# Patient Record
Sex: Female | Born: 1990
Health system: Southern US, Community
[De-identification: ages and names within clinical notes are randomized; demographics above are authoritative.]

## PROBLEM LIST (undated history)

## (undated) ENCOUNTER — Inpatient Hospital Stay (HOSPITAL_COMMUNITY): Payer: Self-pay

## (undated) ENCOUNTER — Emergency Department (HOSPITAL_COMMUNITY): Admission: EM | Payer: 59 | Source: Home / Self Care

## (undated) DIAGNOSIS — D689 Coagulation defect, unspecified: Secondary | ICD-10-CM

## (undated) DIAGNOSIS — R002 Palpitations: Secondary | ICD-10-CM

## (undated) DIAGNOSIS — N96 Recurrent pregnancy loss: Secondary | ICD-10-CM

## (undated) DIAGNOSIS — D649 Anemia, unspecified: Secondary | ICD-10-CM

## (undated) DIAGNOSIS — D6851 Activated protein C resistance: Secondary | ICD-10-CM

## (undated) DIAGNOSIS — N390 Urinary tract infection, site not specified: Secondary | ICD-10-CM

## (undated) DIAGNOSIS — F419 Anxiety disorder, unspecified: Secondary | ICD-10-CM

## (undated) DIAGNOSIS — E282 Polycystic ovarian syndrome: Secondary | ICD-10-CM

## (undated) HISTORY — DX: Polycystic ovarian syndrome: E28.2

---

## 2016-01-10 ENCOUNTER — Telehealth: Payer: Self-pay | Admitting: *Deleted

## 2016-01-10 ENCOUNTER — Ambulatory Visit (INDEPENDENT_AMBULATORY_CARE_PROVIDER_SITE_OTHER): Payer: Self-pay | Admitting: *Deleted

## 2016-01-10 DIAGNOSIS — N912 Amenorrhea, unspecified: Secondary | ICD-10-CM

## 2016-01-10 DIAGNOSIS — Z3491 Encounter for supervision of normal pregnancy, unspecified, first trimester: Secondary | ICD-10-CM

## 2016-01-10 DIAGNOSIS — Z3201 Encounter for pregnancy test, result positive: Secondary | ICD-10-CM

## 2016-01-10 LAB — POCT URINE PREGNANCY: PREG TEST UR: POSITIVE — AB

## 2016-01-10 MED ORDER — VITAFOL ULTRA 29-0.6-0.4-200 MG PO CAPS
1.0000 | ORAL_CAPSULE | Freq: Every day | ORAL | 11 refills | Status: DC
Start: 2016-01-10 — End: 2016-01-14

## 2016-01-13 ENCOUNTER — Emergency Department (HOSPITAL_COMMUNITY)
Admission: EM | Admit: 2016-01-13 | Discharge: 2016-01-13 | Disposition: A | Discharge status: Home / Self Care | Payer: 59 | Attending: Emergency Medicine | Admitting: Emergency Medicine

## 2016-01-13 ENCOUNTER — Encounter (HOSPITAL_COMMUNITY): Payer: Self-pay | Admitting: Emergency Medicine

## 2016-01-13 DIAGNOSIS — Z87891 Personal history of nicotine dependence: Secondary | ICD-10-CM | POA: Diagnosis not present

## 2016-01-13 DIAGNOSIS — K120 Recurrent oral aphthae: Secondary | ICD-10-CM | POA: Diagnosis not present

## 2016-01-13 DIAGNOSIS — R51 Headache: Secondary | ICD-10-CM | POA: Insufficient documentation

## 2016-01-13 DIAGNOSIS — R519 Headache, unspecified: Secondary | ICD-10-CM

## 2016-01-13 MED ORDER — DIPHENHYDRAMINE HCL 12.5 MG/5ML PO ELIX
6.2500 mg | ORAL_SOLUTION | Freq: Once | ORAL | Status: AC
Start: 2016-01-13 — End: 2016-01-13
  Administered 2016-01-13: 6.25 mg via ORAL
  Filled 2016-01-13: qty 10

## 2016-01-13 MED ORDER — BENZOCAINE 10 % MT GEL: 1.0000 "application " | Freq: Three times a day (TID) | OROMUCOSAL | 0 refills | Status: DC | PRN

## 2016-01-13 MED ORDER — ACETAMINOPHEN 500 MG PO TABS
1000.0000 mg | ORAL_TABLET | Freq: Once | ORAL | Status: AC
  Administered 2016-01-13: 1000 mg via ORAL
  Filled 2016-01-13: qty 2

## 2016-01-13 MED ORDER — ALUM & MAG HYDROXIDE-SIMETH 200-200-20 MG/5ML PO SUSP
2.5000 mL | Freq: Once | ORAL | Status: AC
  Administered 2016-01-13: 2.5 mL via ORAL
  Filled 2016-01-13: qty 30

## 2016-01-13 NOTE — ED Provider Notes (Signed)
[facility]-EMERGENCY DEPT Provider Note   CSN: [id number] Arrival date & time: 01/13/16  0211     History   Chief Complaint Chief Complaint  Patient presents with  . Oral Swelling  . Headache    HPI Samantha DarlingSimone Jimenez is a 25 y.o. female G3P0020 with no other major medical hx presents to the Emergency Department complaining of gradual, persistent, progressively worsening mouth pain beginning in the right gingiva and radiating into the right ear and right side of the throat onset 24 hours ago.  Pt reports no sick contacts and does not usually get ulcers.  Eating and drinking makes the symptoms worse.  No treatments PTA.  Pt denies fever, chills, neck pain, neck stiffness, Feeling of throat closing, difficulty swallowing, difficulty breathing.   Pt also reports mild, generalized headache for the last 3 weeks.  She denies history of recurrent headaches.  She reports blurred vision for years and believes she needs glasses.  She denies changes in vision.  She has not taken any medications for this. She denies photophobia, phonophobia. She has nausea without vomiting.  Pt is without fever, neck pain or neck stiffness.  No rash.     The history is provided by the patient and medical records. No language interpreter was used.    History reviewed. No pertinent past medical history.  There are no active problems to display for this patient.   History reviewed. No pertinent surgical history.  OB History    Gravida Para Term Preterm AB Living   1             SAB TAB Ectopic Multiple Live Births                   Home Medications    Prior to Admission medications   Medication Sig Start Date End Date Taking? Authorizing Provider  benzocaine (ORAJEL) 10 % mucosal gel Use as directed 1 application in the mouth or throat 3 (three) times daily as needed for mouth pain. 01/13/16   Thunder [place], PA-C  Prenat-Fe Poly-Methfol-FA-DHA (VITAFOL ULTRA) 29-0.6-0.4-200 MG CAPS Take 1 capsule by  mouth daily. 01/10/16   Roe Coombsachelle A Denney, CNM    Family History History reviewed. No pertinent family history.  Social History Social History  Substance Use Topics  . Smoking status: Former Games developermoker  . Smokeless tobacco: Never Used  . Alcohol use No     Allergies   Percocet [oxycodone-acetaminophen]   Review of Systems Review of Systems  HENT: Positive for mouth sores.   Neurological: Positive for headaches.  All other systems reviewed and are negative.    Physical Exam Updated Vital Signs BP 120/86   Pulse 88   Temp 98.4 F (36.9 C) (Oral)   Resp 16   Ht 5\' 4"  (1.626 m)   Wt 66.7 kg   SpO2 100%   BMI 25.24 kg/m   Physical Exam  Constitutional: She is oriented to person, place, and time. She appears well-developed and well-nourished. No distress.  HENT:  Head: Normocephalic and atraumatic.  Right Ear: Hearing, tympanic membrane, external ear and ear canal normal.  Left Ear: Hearing, tympanic membrane, external ear and ear canal normal.  Nose: Nose normal.  Mouth/Throat: Uvula is midline, oropharynx is clear and moist and mucous membranes are normal. She does not have dentures. Oral lesions present. No trismus in the jaw. Normal dentition. No dental abscesses, lacerations or dental caries. No oropharyngeal exudate, posterior oropharyngeal edema, posterior oropharyngeal erythema or tonsillar abscesses. Tonsils are  1+ on the right. Tonsils are 1+ on the left.  Aphthous ulcer noted at the intersection of the gingiva and buccal Mucosa of the right upper quadrant of her mouth. Open crypts noted to the tonsils but no edema, erythema or exudate  Eyes: Conjunctivae and EOM are normal. Pupils are equal, round, and reactive to light. No scleral icterus.  No horizontal, vertical or rotational nystagmus  Neck: Normal range of motion, full passive range of motion without pain and phonation normal. Neck supple.  Full active and passive ROM without pain No midline or paraspinal  tenderness No nuchal rigidity or meningeal signs Normal phonation Handling secretions without difficulty  Cardiovascular: Normal rate, regular rhythm and intact distal pulses.   Pulmonary/Chest: Effort normal and breath sounds normal. No respiratory distress. She has no wheezes. She has no rales.  Abdominal: Soft. Bowel sounds are normal. There is no tenderness. There is no rebound and no guarding.  Musculoskeletal: Normal range of motion.  Lymphadenopathy:    She has no cervical adenopathy.  Neurological: She is alert and oriented to person, place, and time. She has normal reflexes. No cranial nerve deficit. She exhibits normal muscle tone. Coordination normal.  Mental Status:  Alert, oriented, thought content appropriate. Speech fluent without evidence of aphasia. Able to follow 2 step commands without difficulty.  Cranial Nerves:  II:  Peripheral visual fields grossly normal, pupils equal, round, reactive to light III,IV, VI: ptosis not present, extra-ocular motions intact bilaterally  V,VII: smile symmetric, facial light touch sensation equal VIII: hearing grossly normal bilaterally  IX,X: midline uvula rise  XI: bilateral shoulder shrug equal and strong XII: midline tongue extension  Motor:  5/5 in upper and lower extremities bilaterally including strong and equal grip strength and dorsiflexion/plantar flexion Sensory: Pinprick and light touch normal in all extremities.  Deep Tendon Reflexes: 2+ and symmetric  Cerebellar: normal finger-to-nose with bilateral upper extremities Gait: normal gait and balance CV: distal pulses palpable throughout   Skin: Skin is warm and dry. No rash noted. She is not diaphoretic.  Psychiatric: She has a normal mood and affect. Her behavior is normal. Judgment and thought content normal.  Nursing note and vitals reviewed.    ED Treatments / Results   Procedures Procedures (including critical care time)  Medications Ordered in ED Medications    diphenhydrAMINE (BENADRYL) 12.5 MG/5ML elixir 6.25 mg (6.25 mg Oral Given 01/13/16 0406)  alum & mag hydroxide-simeth (MAALOX/MYLANTA) 200-200-20 MG/5ML suspension 2.5 mL (2.5 mLs Oral Given 01/13/16 0406)  acetaminophen (TYLENOL) tablet 1,000 mg (1,000 mg Oral Given 01/13/16 0413)     Initial Impression / Assessment and Plan / ED Course  I have reviewed the triage vital signs and the nursing notes.  Pertinent labs & imaging results that were available during my care of the patient were reviewed by me and considered in my medical decision making (see chart for details).  Clinical Course  Patient presents with mouth pain radiating to the ear and throat. Aphthous ulcer noted. No tenderness to palpation of the teeth. No evidence of spread of infection. No evidence of Ludwig angina or peritonsillar abscess. Patient is afebrile with nausea or vomiting. No nuchal rigidity. No evidence of meningitis.  Patient also complains of generalized headache without vision changes, fever, nuchal rigidity.  She has a normal neurologic exam. We have given her Tylenol with improvement in her pain. No red flags for her headache. We'll follow-up with primary care.  Final Clinical Impressions(s) / ED Diagnoses   Final  diagnoses:  Nonintractable headache, unspecified chronicity pattern, unspecified headache type  Aphthous ulcer of mouth    New Prescriptions Discharge Medication List as of 01/13/2016  4:09 AM    START taking these medications   Details  benzocaine (ORAJEL) 10 % mucosal gel Use as directed 1 application in the mouth or throat 3 (three) times daily as needed for mouth pain., Starting Sun 01/13/2016, Print         Dahlia Client Altheia Shafran, PA-C 01/13/16 1610    Linwood Dibbles, MD 01/14/16 2128

## 2016-01-13 NOTE — Discharge Instructions (Signed)
1. Medications: orajel for mouth pain, tylenol for headaches, usual home medications 2. Treatment: rest, drink plenty of fluids,  3. Follow Up: Please followup with your primary doctor in 1-2 days for discussion of your diagnoses and further evaluation after today's visit; if you do not have a primary care doctor use the resource guide provided to find one; Please return to the ER for worsening symptoms, fevers, chills, neck stiffness, difficulty talking or swallowing.

## 2016-01-13 NOTE — ED Triage Notes (Signed)
Pt presents to ED for assessment of gum swelling and pain beginning 24 hours ago and worsening to the point pt was awoken from sleep tonight.  Pt is [redacted] weeks pregnant.  Pt also c/o headaches starting 3 weeks ago, and increasing with gum pain.

## 2016-01-14 ENCOUNTER — Other Ambulatory Visit: Payer: Self-pay

## 2016-01-14 ENCOUNTER — Telehealth: Payer: Self-pay

## 2016-01-14 DIAGNOSIS — Z3491 Encounter for supervision of normal pregnancy, unspecified, first trimester: Secondary | ICD-10-CM

## 2016-01-14 MED ORDER — VITAFOL ULTRA 29-0.6-0.4-200 MG PO CAPS: 1.0000 | ORAL_CAPSULE | Freq: Every day | ORAL | 11 refills | Status: DC

## 2016-01-14 NOTE — Telephone Encounter (Signed)
Called patient back regarding RX request sent to pharmacy due to wrong insurance coverage at that time. Patient now has correct insurance and RX resent to pharmacy.

## 2016-01-19 ENCOUNTER — Inpatient Hospital Stay (HOSPITAL_COMMUNITY): Payer: Medicaid Other

## 2016-01-19 ENCOUNTER — Inpatient Hospital Stay (HOSPITAL_COMMUNITY)
Admission: AD | Admit: 2016-01-19 | Discharge: 2016-01-19 | Disposition: A | Discharge status: Home / Self Care | Payer: Medicaid Other | Attending: Obstetrics & Gynecology | Admitting: Obstetrics & Gynecology

## 2016-01-19 ENCOUNTER — Encounter (HOSPITAL_COMMUNITY): Payer: Self-pay

## 2016-01-19 DIAGNOSIS — O209 Hemorrhage in early pregnancy, unspecified: Secondary | ICD-10-CM | POA: Diagnosis present

## 2016-01-19 DIAGNOSIS — Z87891 Personal history of nicotine dependence: Secondary | ICD-10-CM | POA: Insufficient documentation

## 2016-01-19 DIAGNOSIS — O23591 Infection of other part of genital tract in pregnancy, first trimester: Secondary | ICD-10-CM | POA: Diagnosis not present

## 2016-01-19 DIAGNOSIS — N760 Acute vaginitis: Secondary | ICD-10-CM | POA: Diagnosis not present

## 2016-01-19 DIAGNOSIS — B9689 Other specified bacterial agents as the cause of diseases classified elsewhere: Secondary | ICD-10-CM

## 2016-01-19 DIAGNOSIS — Z3A01 Less than 8 weeks gestation of pregnancy: Secondary | ICD-10-CM | POA: Insufficient documentation

## 2016-01-19 LAB — CBC
HCT: 39 % (ref 36.0–46.0)
Hemoglobin: 13.6 g/dL (ref 12.0–15.0)
MCH: 31.1 pg (ref 26.0–34.0)
MCHC: 34.9 g/dL (ref 30.0–36.0)
MCV: 89 fL (ref 78.0–100.0)
PLATELETS: 270 10*3/uL (ref 150–400)
RBC: 4.38 MIL/uL (ref 3.87–5.11)
RDW: 12.5 % (ref 11.5–15.5)
WBC: 6.2 10*3/uL (ref 4.0–10.5)

## 2016-01-19 LAB — WET PREP, GENITAL
SPERM: NONE SEEN
TRICH WET PREP: NONE SEEN
Yeast Wet Prep HPF POC: NONE SEEN

## 2016-01-19 LAB — URINALYSIS, ROUTINE W REFLEX MICROSCOPIC
Bilirubin Urine: NEGATIVE
GLUCOSE, UA: NEGATIVE mg/dL
KETONES UR: NEGATIVE mg/dL
LEUKOCYTES UA: NEGATIVE
Nitrite: NEGATIVE
PH: 6 (ref 5.0–8.0)
Protein, ur: NEGATIVE mg/dL
Specific Gravity, Urine: 1.02 (ref 1.005–1.030)

## 2016-01-19 LAB — URINE MICROSCOPIC-ADD ON

## 2016-01-19 LAB — HCG, QUANTITATIVE, PREGNANCY: HCG, BETA CHAIN, QUANT, S: 303 m[IU]/mL — AB (ref <5)

## 2016-01-19 LAB — ABO/RH: ABO/RH(D): O POS

## 2016-01-19 MED ORDER — METRONIDAZOLE 500 MG PO TABS: 500.0000 mg | ORAL_TABLET | Freq: Two times a day (BID) | ORAL | 0 refills | Status: DC

## 2016-01-19 MED ORDER — PROMETHAZINE HCL 12.5 MG PO TABS: 12.5000 mg | ORAL_TABLET | Freq: Four times a day (QID) | ORAL | 4 refills | Status: DC | PRN

## 2016-01-19 NOTE — Discharge Instructions (Signed)
Bacterial Vaginosis °Bacterial vaginosis is a vaginal infection that occurs when the normal balance of bacteria in the vagina is disrupted. It results from an overgrowth of certain bacteria. This is the most common vaginal infection in women of childbearing age. Treatment is important to prevent complications, especially in pregnant women, as it can cause a premature delivery. °CAUSES  °Bacterial vaginosis is caused by an increase in harmful bacteria that are normally present in smaller amounts in the vagina. Several different kinds of bacteria can cause bacterial vaginosis. However, the reason that the condition develops is not fully understood. °RISK FACTORS °Certain activities or behaviors can put you at an increased risk of developing bacterial vaginosis, including: °· Having a new sex partner or multiple sex partners. °· Douching. °· Using an intrauterine device (IUD) for contraception. °Women do not get bacterial vaginosis from toilet seats, bedding, swimming pools, or contact with objects around them. °SIGNS AND SYMPTOMS  °Some women with bacterial vaginosis have no signs or symptoms. Common symptoms include: °· Grey vaginal discharge. °· A fishlike odor with discharge, especially after sexual intercourse. °· Itching or burning of the vagina and vulva. °· Burning or pain with urination. °DIAGNOSIS  °Your health care provider will take a medical history and examine the vagina for signs of bacterial vaginosis. A sample of vaginal fluid may be taken. Your health care provider will look at this sample under a microscope to check for bacteria and abnormal cells. A vaginal pH test may also be done.  °TREATMENT  °Bacterial vaginosis may be treated with antibiotic medicines. These may be given in the form of a pill or a vaginal cream. A second round of antibiotics may be prescribed if the condition comes back after treatment. Because bacterial vaginosis increases your risk for sexually transmitted diseases, getting  treated can help reduce your risk for chlamydia, gonorrhea, HIV, and herpes. °HOME CARE INSTRUCTIONS  °· Only take over-the-counter or prescription medicines as directed by your health care provider. °· If antibiotic medicine was prescribed, take it as directed. Make sure you finish it even if you start to feel better. °· Tell all sexual partners that you have a vaginal infection. They should see their health care provider and be treated if they have problems, such as a mild rash or itching. °· During treatment, it is important that you follow these instructions: °· Avoid sexual activity or use condoms correctly. °· Do not douche. °· Avoid alcohol as directed by your health care provider. °· Avoid breastfeeding as directed by your health care provider. °SEEK MEDICAL CARE IF:  °· Your symptoms are not improving after 3 days of treatment. °· You have increased discharge or pain. °· You have a fever. °MAKE SURE YOU:  °· Understand these instructions. °· Will watch your condition. °· Will get help right away if you are not doing well or get worse. °FOR MORE INFORMATION  °Centers for Disease Control and Prevention, Division of STD Prevention: www.cdc.gov/std °American Sexual Health Association (ASHA): www.ashastd.org  °  °This information is not intended to replace advice given to you by your health care provider. Make sure you discuss any questions you have with your health care provider. °  °Document Released: 03/10/2005 Document Revised: 03/31/2014 Document Reviewed: 10/20/2012 °Elsevier Interactive Patient Education ©2016 Elsevier Inc. ° °Threatened Miscarriage °A threatened miscarriage occurs when you have vaginal bleeding during your first 20 weeks of pregnancy but the pregnancy has not ended. If you have vaginal bleeding during this time, your health care provider   do tests to make sure you are still pregnant. If the tests show you are still pregnant and the developing baby (fetus) inside your womb (uterus) is  still growing, your condition is considered a threatened miscarriage. A threatened miscarriage does not mean your pregnancy will end, but it does increase the risk of losing your pregnancy (complete miscarriage). CAUSES  The cause of a threatened miscarriage is usually not known. If you go on to have a complete miscarriage, the most common cause is an abnormal number of chromosomes in the developing baby. Chromosomes are the structures inside cells that hold all your genetic material. Some causes of vaginal bleeding that do not result in miscarriage include:  Having sex.  Having an infection.  Normal hormone changes of pregnancy.  Bleeding that occurs when an egg implants in your uterus. RISK FACTORS Risk factors for bleeding in early pregnancy include:  Obesity.  Smoking.  Drinking excessive amounts of alcohol or caffeine.  Recreational drug use. SIGNS AND SYMPTOMS  Light vaginal bleeding.  Mild abdominal pain or cramps. DIAGNOSIS  If you have bleeding with or without abdominal pain before 20 weeks of pregnancy, your health care provider will do tests to check whether you are still pregnant. One important test involves using sound waves and a computer (ultrasound) to create images of the inside of your uterus. Other tests include an internal exam of your vagina and uterus (pelvic exam) and measurement of your baby's heart rate.  You may be diagnosed with a threatened miscarriage if:  Ultrasound testing shows you are still pregnant.  Your baby's heart rate is strong.  A pelvic exam shows that the opening between your uterus and your vagina (cervix) is closed.  Your heart rate and blood pressure are stable.  Blood tests confirm you are still pregnant. TREATMENT  No treatments have been shown to prevent a threatened miscarriage from going on to a complete miscarriage. However, the right home care is important.  HOME CARE INSTRUCTIONS   Make sure you keep all your  appointments for prenatal care. This is very important.  Get plenty of rest.  Do not have sex or use tampons if you have vaginal bleeding.  Do not douche.  Do not smoke or use recreational drugs.  Do not drink alcohol.  Avoid caffeine. SEEK MEDICAL CARE IF:  You have light vaginal bleeding or spotting while pregnant.  You have abdominal pain or cramping.  You have a fever. SEEK IMMEDIATE MEDICAL CARE IF:  You have heavy vaginal bleeding.  You have blood clots coming from your vagina.  You have severe low back pain or abdominal cramps.  You have fever, chills, and severe abdominal pain. MAKE SURE YOU:  Understand these instructions.  Will watch your condition.  Will get help right away if you are not doing well or get worse.   This information is not intended to replace advice given to you by your health care provider. Make sure you discuss any questions you have with your health care provider.   Document Released: 03/10/2005 Document Revised: 03/15/2013 Document Reviewed: 01/04/2013 Elsevier Interactive Patient Education [employer]2016 Elsevier Inc.

## 2016-01-19 NOTE — MAU Provider Note (Signed)
  History     CSN: [id number]  Arrival date and time: 01/19/16 [id number]   First Provider Initiated Contact with Patient 01/19/16 1025      Chief Complaint  Patient presents with  . Vaginal Bleeding   HPI Samantha Jimenez 25 y.o. [id number]  Having light red vaginal bleeding for several days.  Also has noted periodic lower abdominal cramping and some low back pain.Marland Kitchen.  Hx of irregular periods and 2 previous early miscarriages.  Has an appointment at [facility] next week.  Has not yet been seen for prenatal care.  Is worried about this pregnancy.     OB History    Gravida Para Term Preterm AB Living   3       2     SAB TAB Ectopic Multiple Live Births   2              No past medical history on file.  No past surgical history on file.  No family history on file.  Social History  Substance Use Topics  . Smoking status: Former Games developermoker  . Smokeless tobacco: Never Used  . Alcohol use No    Allergies:  Allergies  Allergen Reactions  . Percocet [Oxycodone-Acetaminophen] Itching and Nausea And Vomiting    Prescriptions Prior to Admission  Medication Sig Dispense Refill Last Dose  . acetaminophen (TYLENOL) 325 MG tablet Take 650 mg by mouth every 6 (six) hours as needed for moderate pain.   Past Month at Unknown time  . Prenat-Fe Poly-Methfol-FA-DHA (VITAFOL ULTRA) 29-0.6-0.4-200 MG CAPS Take 1 capsule by mouth daily. 30 capsule 11 01/18/2016 at Unknown time  . benzocaine (ORAJEL) 10 % mucosal gel Use as directed 1 application in the mouth or throat 3 (three) times daily as needed for mouth pain. (Patient not taking: Reported on 01/19/2016) 5.3 g 0 Not Taking at Unknown time    Review of Systems  Constitutional: Negative for fever.  Gastrointestinal: Positive for abdominal pain. Negative for nausea and vomiting.  Genitourinary:       No vaginal discharge. Vaginal bleeding. No dysuria.  Musculoskeletal: Positive for back pain.   Physical Exam   Blood pressure 119/78, pulse 87,  temperature 98 F (36.7 C), resp. rate 16, height 5\' 4"  (1.626 m), weight 147 lb (66.7 kg), last menstrual period 11/26/2015.  Physical Exam  MAU Course  Procedures  MDM   Assessment and Plan    Lopaka Karge L Myrle Dues 01/19/2016, 10:33 AM

## 2016-01-19 NOTE — MAU Note (Signed)
Patient presents with vagina bleeding that started yesterday afternoon, lighter than her normal period.

## 2016-01-20 LAB — RPR: RPR Ser Ql: NONREACTIVE

## 2016-01-20 LAB — HIV ANTIBODY (ROUTINE TESTING W REFLEX): HIV Screen 4th Generation wRfx: NONREACTIVE

## 2016-01-21 ENCOUNTER — Encounter (HOSPITAL_COMMUNITY): Payer: Self-pay | Admitting: *Deleted

## 2016-01-21 ENCOUNTER — Inpatient Hospital Stay (HOSPITAL_COMMUNITY)
Admission: AD | Admit: 2016-01-21 | Discharge: 2016-01-21 | Disposition: A | Discharge status: Home / Self Care | Payer: 59 | Source: Ambulatory Visit | Attending: Family Medicine | Admitting: Family Medicine

## 2016-01-21 DIAGNOSIS — Z87891 Personal history of nicotine dependence: Secondary | ICD-10-CM | POA: Diagnosis not present

## 2016-01-21 DIAGNOSIS — Z3A08 8 weeks gestation of pregnancy: Secondary | ICD-10-CM | POA: Diagnosis not present

## 2016-01-21 DIAGNOSIS — O209 Hemorrhage in early pregnancy, unspecified: Secondary | ICD-10-CM | POA: Diagnosis present

## 2016-01-21 DIAGNOSIS — O039 Complete or unspecified spontaneous abortion without complication: Secondary | ICD-10-CM | POA: Insufficient documentation

## 2016-01-21 LAB — CBC
HCT: 39.6 % (ref 36.0–46.0)
Hemoglobin: 13.9 g/dL (ref 12.0–15.0)
MCH: 31.2 pg (ref 26.0–34.0)
MCHC: 35.1 g/dL (ref 30.0–36.0)
MCV: 88.8 fL (ref 78.0–100.0)
PLATELETS: 273 10*3/uL (ref 150–400)
RBC: 4.46 MIL/uL (ref 3.87–5.11)
RDW: 12.7 % (ref 11.5–15.5)
WBC: 6.7 10*3/uL (ref 4.0–10.5)

## 2016-01-21 LAB — GC/CHLAMYDIA PROBE AMP (~~LOC~~) NOT AT ARMC
Chlamydia: NEGATIVE
Neisseria Gonorrhea: NEGATIVE

## 2016-01-21 LAB — HCG, QUANTITATIVE, PREGNANCY: hCG, Beta Chain, Quant, S: 95 m[IU]/mL — ABNORMAL HIGH (ref <5)

## 2016-01-21 MED ORDER — IBUPROFEN 600 MG PO TABS: 600.0000 mg | ORAL_TABLET | Freq: Four times a day (QID) | ORAL | 1 refills | Status: DC | PRN

## 2016-01-21 MED ORDER — HYDROMORPHONE HCL 2 MG PO TABS
2.0000 mg | ORAL_TABLET | ORAL | Status: DC | PRN
  Administered 2016-01-21: 2 mg via ORAL
  Filled 2016-01-21: qty 1

## 2016-01-21 MED ORDER — OXYCODONE-ACETAMINOPHEN 5-325 MG PO TABS: 1.0000 | ORAL_TABLET | ORAL | 0 refills | Status: DC | PRN

## 2016-01-21 NOTE — MAU Note (Signed)
Is having a miscarriage, her third one. Started spotting 3 days ago, is now bleeding heavier and passing penny sized clots.  Is having cramping

## 2016-01-21 NOTE — Discharge Instructions (Signed)
Miscarriage  A miscarriage is the sudden loss of an unborn baby (fetus) before the 20th week of pregnancy. Most miscarriages happen in the first 3 months of pregnancy. Sometimes, it happens before a woman even knows she is pregnant. A miscarriage is also called a "spontaneous miscarriage" or "early pregnancy loss." Having a miscarriage can be an emotional experience. Talk with your caregiver about any questions you may have about miscarrying, the grieving process, and your future pregnancy plans.  CAUSES    Problems with the fetal chromosomes that make it impossible for the baby to develop normally. Problems with the baby's genes or chromosomes are most often the result of errors that occur, by chance, as the embryo divides and grows. The problems are not inherited from the parents.   Infection of the cervix or uterus.    Hormone problems.    Problems with the cervix, such as having an incompetent cervix. This is when the tissue in the cervix is not strong enough to hold the pregnancy.    Problems with the uterus, such as an abnormally shaped uterus, uterine fibroids, or congenital abnormalities.    Certain medical conditions.    Smoking, drinking alcohol, or taking illegal drugs.    Trauma.   Often, the cause of a miscarriage is unknown.   SYMPTOMS    Vaginal bleeding or spotting, with or without cramps or pain.   Pain or cramping in the abdomen or lower back.   Passing fluid, tissue, or blood clots from the vagina.  DIAGNOSIS   Your caregiver will perform a physical exam. You may also have an ultrasound to confirm the miscarriage. Blood or urine tests may also be ordered.  TREATMENT    Sometimes, treatment is not necessary if you naturally pass all the fetal tissue that was in the uterus. If some of the fetus or placenta remains in the body (incomplete miscarriage), tissue left behind may become infected and must be removed. Usually, a dilation and curettage (D and C) procedure is performed.  During a D and C procedure, the cervix is widened (dilated) and any remaining fetal or placental tissue is gently removed from the uterus.   Antibiotic medicines are prescribed if there is an infection. Other medicines may be given to reduce the size of the uterus (contract) if there is a lot of bleeding.   If you have Rh negative blood and your baby was Rh positive, you will need a Rh immunoglobulin shot. This shot will protect any future baby from having Rh blood problems in future pregnancies.  HOME CARE INSTRUCTIONS    Your caregiver may order bed rest or may allow you to continue light activity. Resume activity as directed by your caregiver.   Have someone help with home and family responsibilities during this time.    Keep track of the number of sanitary pads you use each day and how soaked (saturated) they are. Write down this information.    Do not use tampons. Do not douche or have sexual intercourse until approved by your caregiver.    Only take over-the-counter or prescription medicines for pain or discomfort as directed by your caregiver.    Do not take aspirin. Aspirin can cause bleeding.    Keep all follow-up appointments with your caregiver.    If you or your partner have problems with grieving, talk to your caregiver or seek counseling to help cope with the pregnancy loss. Allow enough time to grieve before trying to get pregnant again.     SEEK IMMEDIATE MEDICAL CARE IF:    You have severe cramps or pain in your back or abdomen.   You have a fever.   You pass large blood clots (walnut-sized or larger) ortissue from your vagina. Save any tissue for your caregiver to inspect.    Your bleeding increases.    You have a thick, bad-smelling vaginal discharge.   You become lightheaded, weak, or you faint.    You have chills.   MAKE SURE YOU:   Understand these instructions.   Will watch your condition.   Will get help right away if you are not doing well or get worse.     This  information is not intended to replace advice given to you by your health care provider. Make sure you discuss any questions you have with your health care provider.     Document Released: 09/03/2000 Document Revised: 07/05/2012 Document Reviewed: 04/29/2011  Elsevier Interactive Patient Education 2016 Elsevier Inc.

## 2016-01-21 NOTE — MAU Provider Note (Signed)
  History     CSN: [id number]  Arrival date and time: 01/21/16 1641   First Provider Initiated Contact with Patient 01/21/16 1745      Chief Complaint  Patient presents with  . Abdominal Pain  . Vaginal Bleeding  . Threatened Miscarriage   HPI 25 yo G3P0020 at 6156w0d by LMP presenting today with vaginal bleeding and cramping. Patient reports onset of vaginal spotting 3 days ago but today it progressed with a heavier flow with passage of small clots. She also reports some sharp cramping pains. Patient reports that this is her 3rd miscarriage. They were al in the first trimester. The first 2 were with her husband and this pregnancy is with a different partner. She denies any medical conditions.   History reviewed. No pertinent past medical history.  History reviewed. No pertinent surgical history.  History reviewed. No pertinent family history.  Social History  Substance Use Topics  . Smoking status: Former Games developermoker  . Smokeless tobacco: Never Used  . Alcohol use No    Allergies:  Allergies  Allergen Reactions  . Percocet [Oxycodone-Acetaminophen] Itching and Nausea And Vomiting    Prescriptions Prior to Admission  Medication Sig Dispense Refill Last Dose  . acetaminophen (TYLENOL) 325 MG tablet Take 650 mg by mouth every 6 (six) hours as needed for mild pain, moderate pain or headache.    01/21/2016 at 1100  . metroNIDAZOLE (FLAGYL) 500 MG tablet Take 1 tablet (500 mg total) by mouth 2 (two) times daily. 14 tablet 0 01/21/2016 at Unknown time  . Prenat-Fe Poly-Methfol-FA-DHA (VITAFOL ULTRA) 29-0.6-0.4-200 MG CAPS Take 1 capsule by mouth daily. 30 capsule 11 01/21/2016 at Unknown time  . promethazine (PHENERGAN) 12.5 MG tablet Take 1 tablet (12.5 mg total) by mouth every 6 (six) hours as needed for nausea or vomiting. 30 tablet 4 01/20/2016 at Unknown time    ROS Physical Exam   Blood pressure 121/86, pulse 75, temperature 98.3 F (36.8 C), temperature source Oral, resp.  rate 18, height 5' 3.5" (1.613 m), weight 66.5 kg (146 lb 9.6 oz), last menstrual period 11/26/2015, SpO2 99 %.  Physical Exam  GENERAL: Well-developed, well-nourished female in no acute distress.  ABDOMEN: Soft, nontender, nondistended.  PELVIC: Normal external female genitalia. Vagina is pink and rugated.  Blood in vaginal vault and Samantha Jimenez slow flow. Normal appearing cervix with a closed os. Uterus is normal in size. No adnexal mass or tenderness. EXTREMITIES: No cyanosis, clubbing, or edema, 2+ distal pulses.   MAU Course  Procedures  MDM CBC Quant HCG- 95 ( previously 303 Dilaudid 2 mg  Assessment and Plan  25 yo G3P0020 at 8 weeks by LMP with a spontanenous miscarriage - Discussed meeting with a genetic counselor and thrombophilia work up before trying to conceive again - Follow up with GYN as scheduled - Pain management with percocet alternating with ibuprofen and application of a heating pad  Samantha Jimenez 01/21/2016, 7:17 PM

## 2016-01-30 ENCOUNTER — Encounter: Payer: Self-pay | Admitting: Obstetrics & Gynecology

## 2016-02-22 ENCOUNTER — Encounter (HOSPITAL_COMMUNITY): Payer: Self-pay | Admitting: *Deleted

## 2016-02-22 ENCOUNTER — Emergency Department (HOSPITAL_COMMUNITY)
Admission: EM | Admit: 2016-02-22 | Discharge: 2016-02-22 | Disposition: A | Discharge status: Home / Self Care | Payer: 59 | Attending: Emergency Medicine | Admitting: Emergency Medicine

## 2016-02-22 DIAGNOSIS — N3090 Cystitis, unspecified without hematuria: Secondary | ICD-10-CM

## 2016-02-22 DIAGNOSIS — F17200 Nicotine dependence, unspecified, uncomplicated: Secondary | ICD-10-CM | POA: Diagnosis not present

## 2016-02-22 DIAGNOSIS — N3000 Acute cystitis without hematuria: Secondary | ICD-10-CM | POA: Insufficient documentation

## 2016-02-22 DIAGNOSIS — N390 Urinary tract infection, site not specified: Secondary | ICD-10-CM | POA: Diagnosis present

## 2016-02-22 HISTORY — DX: Anemia, unspecified: D64.9

## 2016-02-22 HISTORY — DX: Urinary tract infection, site not specified: N39.0

## 2016-02-22 HISTORY — DX: Recurrent pregnancy loss: N96

## 2016-02-22 LAB — POC URINE PREG, ED: PREG TEST UR: NEGATIVE

## 2016-02-22 LAB — URINALYSIS, ROUTINE W REFLEX MICROSCOPIC
Glucose, UA: NEGATIVE mg/dL
Ketones, ur: 15 mg/dL — AB
NITRITE: NEGATIVE
PROTEIN: 30 mg/dL — AB
SPECIFIC GRAVITY, URINE: 1.027 (ref 1.005–1.030)
pH: 5.5 (ref 5.0–8.0)

## 2016-02-22 LAB — URINE MICROSCOPIC-ADD ON

## 2016-02-22 MED ORDER — PHENAZOPYRIDINE HCL 100 MG PO TABS
ORAL_TABLET | ORAL | Status: AC
  Filled 2016-02-22: qty 1

## 2016-02-22 MED ORDER — PHENAZOPYRIDINE HCL 100 MG PO TABS
200.0000 mg | ORAL_TABLET | Freq: Three times a day (TID) | ORAL | Status: DC
  Administered 2016-02-22: 200 mg via ORAL

## 2016-02-22 MED ORDER — SULFAMETHOXAZOLE-TRIMETHOPRIM 800-160 MG PO TABS
ORAL_TABLET | ORAL | Status: AC
  Filled 2016-02-22: qty 1

## 2016-02-22 MED ORDER — ONDANSETRON 4 MG PO TBDP
4.0000 mg | ORAL_TABLET | Freq: Once | ORAL | Status: AC
  Administered 2016-02-22: 4 mg via ORAL

## 2016-02-22 MED ORDER — ONDANSETRON 4 MG PO TBDP
ORAL_TABLET | ORAL | Status: AC
  Filled 2016-02-22: qty 1

## 2016-02-22 MED ORDER — SULFAMETHOXAZOLE-TRIMETHOPRIM 800-160 MG PO TABS: 1.0000 | ORAL_TABLET | Freq: Two times a day (BID) | ORAL | 0 refills | Status: AC

## 2016-02-22 MED ORDER — SULFAMETHOXAZOLE-TRIMETHOPRIM 800-160 MG PO TABS
1.0000 | ORAL_TABLET | Freq: Once | ORAL | Status: AC
  Administered 2016-02-22: 1 via ORAL

## 2016-02-22 MED ORDER — PHENAZOPYRIDINE HCL 200 MG PO TABS: 200.0000 mg | ORAL_TABLET | Freq: Three times a day (TID) | ORAL | 0 refills | Status: DC

## 2016-02-22 NOTE — ED Notes (Signed)
Patient is A&Ox4 at this time.  Patient in no signs of distress.  Please see providers note for complete history and physical exam.  

## 2016-02-22 NOTE — ED Triage Notes (Signed)
C/o UTI sx, h/o same, reports dysuria, nausea, frequency, urgency, back pain and d/c, (denies: fever, hematuria of vd), no meds PTA. Drinking water at this time. Mentions recent miscarriage (3rd).

## 2016-02-22 NOTE — ED Provider Notes (Signed)
[facility]EMER[facility]Y DEPT Provider Note   CSN: [id number] Arrival date & time: 02/22/16  2120  By signing my name below, I, Doreatha MartinEva Mathews, attest that this documentation has been prepared under the direction and in the presence of  [facility] M. Damian LeavellNeese, NP. Electronically Signed: Doreatha MartinEva Mathews, ED Scribe. 02/22/16. 10:28 PM.    History   Chief Complaint Chief Complaint  Patient presents with  . Recurrent UTI    HPI Samantha Jimenez is a 25 y.o. female with h/o recurrent UTI who presents to the Emergency Department complaining of moderate dysuria onset this morning with associated lower back pain, frequency, urgency, nausea. Pt denies taking OTC medications or trying cranberry juice at home to improve symptoms. She states her current symptoms are similar to h/o recurrent UTI. She denies fever, chills, vaginal discharge or bleeding. No recent unprotected sexual encounters or concern for pregnancy.     The history is provided by the patient. No language interpreter was used.  Dysuria   This is a recurrent problem. The current episode started 6 to 12 hours ago. The problem occurs every urination. The problem has been gradually worsening. The pain is moderate. There has been no fever. Associated symptoms include nausea, frequency and urgency. Pertinent negatives include no chills. She has tried nothing for the symptoms. Her past medical history is significant for recurrent UTIs.    Past Medical History:  Diagnosis Date  . Anemia   . History of multiple miscarriages   . Recurrent UTI     There are no active problems to display for this patient.   History reviewed. No pertinent surgical history.  OB History    Gravida Para Term Preterm AB Living   3       2 0   SAB TAB Ectopic Multiple Live Births   2               Home Medications    Prior to Admission medications   Medication Sig Start Date End Date Taking? Authorizing Provider  ibuprofen (ADVIL,MOTRIN) 600 MG tablet Take 1 tablet (600 mg  total) by mouth every 6 (six) hours as needed. 01/21/16   Peggy Constant, MD  metroNIDAZOLE (FLAGYL) 500 MG tablet Take 1 tablet (500 mg total) by mouth 2 (two) times daily. 01/19/16   Montez MoritaMarie D Lawson, CNM  oxyCODONE-acetaminophen (PERCOCET/ROXICET) 5-325 MG tablet Take 1 tablet by mouth every 4 (four) hours as needed. 01/21/16   Peggy Constant, MD  phenazopyridine (PYRIDIUM) 200 MG tablet Take 1 tablet (200 mg total) by mouth 3 (three) times daily. 02/22/16   Denni France Orlene OchM Reilley Latorre, NP  Prenat-Fe Poly-Methfol-FA-DHA (VITAFOL ULTRA) 29-0.6-0.4-200 MG CAPS Take 1 capsule by mouth daily. 01/14/16   Rachelle A Denney, CNM  promethazine (PHENERGAN) 12.5 MG tablet Take 1 tablet (12.5 mg total) by mouth every 6 (six) hours as needed for nausea or vomiting. 01/19/16   Montez MoritaMarie D Lawson, CNM  sulfamethoxazole-trimethoprim (BACTRIM DS,SEPTRA DS) 800-160 MG tablet Take 1 tablet by mouth 2 (two) times daily. 02/22/16 02/29/16  Sharae Zappulla Orlene OchM Dennison Mcdaid, NP    Family History History reviewed. No pertinent family history.  Social History Social History  Substance Use Topics  . Smoking status: Current Every Day Smoker  . Smokeless tobacco: Never Used  . Alcohol use Yes     Allergies   Percocet [oxycodone-acetaminophen]   Review of Systems Review of Systems  Constitutional: Negative for chills and fever.  Gastrointestinal: Positive for nausea.  Genitourinary: Positive for dysuria, frequency and urgency. Negative for vaginal  bleeding and vaginal discharge.  Musculoskeletal: Positive for back pain.  All other systems reviewed and are negative.    Physical Exam Updated Vital Signs BP 111/83 (BP Location: Right Arm)   Pulse 71   Temp 98.3 F (36.8 C) (Oral)   Resp 17   Ht 5\' 4"  (1.626 m)   Wt 65.4 kg   LMP 11/26/2015 (Approximate) Comment: estimate  SpO2 99%   Breastfeeding? No Comment: recent miscarriage  BMI 24.75 kg/m   Physical Exam  Constitutional: She appears well-developed and well-nourished.  HENT:    Head: Normocephalic.  Eyes: Conjunctivae are normal.  Cardiovascular: Normal rate and regular rhythm.   Pulmonary/Chest: Effort normal and breath sounds normal.  Abdominal: Soft. Bowel sounds are normal. She exhibits no distension. There is tenderness. There is no rebound and no guarding.  No CVA tenderness. Suprapubic tenderness. No guarding, no rebound.   Genitourinary:  Genitourinary Comments: Declined pelvic exam, pt states no concern for STD.   Musculoskeletal: Normal range of motion.  Neurological: She is alert.  Skin: Skin is warm and dry.  Psychiatric: She has a normal mood and affect. Her behavior is normal.  Nursing note and vitals reviewed.    ED Treatments / Results   DIAGNOSTIC STUDIES: Oxygen Saturation is 99% on RA, normal by my interpretation.    COORDINATION OF CARE: 10:25 PM Discussed treatment plan with pt at bedside which includes UA and pt agreed to plan.    Labs (all labs ordered are listed, but only abnormal results are displayed) Radiology No results found.  Procedures Procedures (including critical care time)  Medications Ordered in ED Medications  phenazopyridine (PYRIDIUM) 100 MG tablet (not administered)  sulfamethoxazole-trimethoprim (BACTRIM DS,SEPTRA DS) 800-160 MG per tablet (not administered)  ondansetron (ZOFRAN-ODT) 4 MG disintegrating tablet (not administered)  sulfamethoxazole-trimethoprim (BACTRIM DS,SEPTRA DS) 800-160 MG per tablet 1 tablet (1 tablet Oral Given 02/22/16 2239)  ondansetron (ZOFRAN-ODT) disintegrating tablet 4 mg (4 mg Oral Given 02/22/16 2239)     Initial Impression / Assessment and Plan / ED Course  I have reviewed the triage vital signs and the nursing notes.  Pertinent lab results that were available during my care of the patient were reviewed by me and considered in my medical decision making (see chart for details).  Clinical Course     Pt diagnosed with a UTI. Pt is afebrile, without tachycardia,  hypotension, or other signs of serious infection. Discussed concerns for unprotected sex and STDs and pt declined any concern.  Pt to be dc home with antibiotics and instructions to follow up with PCP if symptoms persist. Discussed return precautions. Pt appears safe for discharge. Urine sent for culture.  Final Clinical Impressions(s) / ED Diagnoses   Final diagnoses:  Cystitis    New Prescriptions Discharge Medication List as of 02/22/2016 10:38 PM    START taking these medications   Details  phenazopyridine (PYRIDIUM) 200 MG tablet Take 1 tablet (200 mg total) by mouth 3 (three) times daily., Starting Fri 02/22/2016, Print    sulfamethoxazole-trimethoprim (BACTRIM DS,SEPTRA DS) 800-160 MG tablet Take 1 tablet by mouth 2 (two) times daily., Starting Fri 02/22/2016, Until Fri 02/29/2016, Print        I personally performed the services described in this documentation, which was scribed in my presence. The recorded information has been reviewed and is accurate.    [address]Hope WhitelandM Donathan Buller, NP 02/28/16 [id number]    Lorre NickAnthony Allen, MD 02/28/16 [phone]

## 2016-02-22 NOTE — ED Notes (Signed)
Patient Alert and oriented X4. Stable and ambulatory. Patient verbalized understanding of the discharge instructions.  Patient belongings were taken by the patient.  

## 2016-02-25 LAB — URINE CULTURE

## 2016-02-26 ENCOUNTER — Telehealth (HOSPITAL_BASED_OUTPATIENT_CLINIC_OR_DEPARTMENT_OTHER): Payer: Self-pay | Admitting: Emergency Medicine

## 2016-02-26 NOTE — Telephone Encounter (Signed)
Post ED Visit - Positive Culture Follow-up  Culture report reviewed by antimicrobial stewardship pharmacist:  []  Samantha Jimenez, Pharm.D. []  Samantha Jimenez, Pharm.D., BCPS []  Samantha Jimenez, Pharm.D. []  Samantha Jimenez, Pharm.D., BCPS []  Samantha Jimenez, [place]D., BCPS, AAHIVP []  Samantha Jimenez, Pharm.D., BCPS, AAHIVP []  Samantha Jimenez, [address]Pharm.D. []  Samantha Jimenez, [place]arm.D.  Positive urine culture Treated with sulfamethoxazole, organism sensitive to the same and no further patient follow-up is required at this time.  Samantha Jimenez, Samantha Jimenez 02/26/2016, 10:08 AM

## 2016-05-30 ENCOUNTER — Encounter (HOSPITAL_COMMUNITY): Payer: Self-pay | Admitting: Emergency Medicine

## 2016-05-30 ENCOUNTER — Emergency Department (HOSPITAL_COMMUNITY)
Admission: EM | Admit: 2016-05-30 | Discharge: 2016-05-30 | Disposition: A | Discharge status: Home / Self Care | Payer: 59 | Attending: Emergency Medicine | Admitting: Emergency Medicine

## 2016-05-30 DIAGNOSIS — J111 Influenza due to unidentified influenza virus with other respiratory manifestations: Secondary | ICD-10-CM

## 2016-05-30 DIAGNOSIS — F17200 Nicotine dependence, unspecified, uncomplicated: Secondary | ICD-10-CM | POA: Diagnosis not present

## 2016-05-30 DIAGNOSIS — R112 Nausea with vomiting, unspecified: Secondary | ICD-10-CM | POA: Insufficient documentation

## 2016-05-30 DIAGNOSIS — R69 Illness, unspecified: Secondary | ICD-10-CM

## 2016-05-30 MED ORDER — SODIUM CHLORIDE 0.9 % IV BOLUS (SEPSIS)
1000.0000 mL | Freq: Once | INTRAVENOUS | Status: AC
  Administered 2016-05-30: 1000 mL via INTRAVENOUS

## 2016-05-30 MED ORDER — ONDANSETRON 8 MG PO TBDP: 8.0000 mg | ORAL_TABLET | Freq: Three times a day (TID) | ORAL | 0 refills | Status: DC | PRN

## 2016-05-30 MED ORDER — ONDANSETRON HCL 4 MG/2ML IJ SOLN
4.0000 mg | Freq: Once | INTRAMUSCULAR | Status: AC
  Administered 2016-05-30: 4 mg via INTRAVENOUS
  Filled 2016-05-30: qty 2

## 2016-05-30 MED ORDER — ACETAMINOPHEN 500 MG PO TABS
1000.0000 mg | ORAL_TABLET | Freq: Once | ORAL | Status: AC
  Administered 2016-05-30: 1000 mg via ORAL
  Filled 2016-05-30: qty 2

## 2016-05-30 NOTE — ED Notes (Signed)
Pt states she understands instructions. Hime stable with Mom meeting her in lobby. Steady gait.

## 2016-05-30 NOTE — ED Provider Notes (Signed)
[facility] Provider Note   CSN: [id number] Arrival date & time: 05/30/16  0706     History   Chief Complaint Chief Complaint  Patient presents with  . Influenza  . Emesis    HPI Samantha Jimenez is a 26 y.o. female.  Patient c/o sore throat, body aches, intermittent mild headaches, episodic non prod cough, subj fever, since yesterday AM, today had onset nv. No bloody emesis. No diarrhea.  No known ill contacts. No chest pain or sob. No abd pain. No dysuria. lnmp 3 weeks ago. No vaginal discharge or bleeding.    The history is provided by the patient.  Influenza  Presenting symptoms: cough, headache, sore throat and vomiting   Presenting symptoms: no fever and no shortness of breath   Emesis   Associated symptoms include cough and headaches. Pertinent negatives include no abdominal pain and no fever.    Past Medical History:  Diagnosis Date  . Anemia   . History of multiple miscarriages   . Recurrent UTI     There are no active problems to display for this patient.   History reviewed. No pertinent surgical history.  OB History    Gravida Para Term Preterm AB Living   3       2 0   SAB TAB Ectopic Multiple Live Births   2               Home Medications    Prior to Admission medications   Medication Sig Start Date End Date Taking? Authorizing Provider  ibuprofen (ADVIL,MOTRIN) 600 MG tablet Take 1 tablet (600 mg total) by mouth every 6 (six) hours as needed. 01/21/16   Peggy Constant, MD  metroNIDAZOLE (FLAGYL) 500 MG tablet Take 1 tablet (500 mg total) by mouth 2 (two) times daily. 01/19/16   Montez MoritaMarie D Lawson, CNM  oxyCODONE-acetaminophen (PERCOCET/ROXICET) 5-325 MG tablet Take 1 tablet by mouth every 4 (four) hours as needed. 01/21/16   Peggy Constant, MD  phenazopyridine (PYRIDIUM) 200 MG tablet Take 1 tablet (200 mg total) by mouth 3 (three) times daily. 02/22/16   Hope Orlene OchM Neese, NP  Prenat-Fe Poly-Methfol-FA-DHA (VITAFOL ULTRA) 29-0.6-0.4-200 MG CAPS  Take 1 capsule by mouth daily. 01/14/16   Rachelle A Denney, CNM  promethazine (PHENERGAN) 12.5 MG tablet Take 1 tablet (12.5 mg total) by mouth every 6 (six) hours as needed for nausea or vomiting. 01/19/16   Montez MoritaMarie D Lawson, CNM    Family History History reviewed. No pertinent family history.  Social History Social History  Substance Use Topics  . Smoking status: Current Every Day Smoker  . Smokeless tobacco: Never Used  . Alcohol use Yes     Allergies   Percocet [oxycodone-acetaminophen]   Review of Systems Review of Systems  Constitutional: Negative for fever.  HENT: Positive for sore throat.   Eyes: Negative for redness.  Respiratory: Positive for cough. Negative for shortness of breath.   Cardiovascular: Negative for chest pain.  Gastrointestinal: Positive for vomiting. Negative for abdominal pain.  Genitourinary: Negative for flank pain.  Musculoskeletal: Negative for back pain and neck pain.  Skin: Negative for rash.  Neurological: Positive for headaches.  Hematological: Does not bruise/bleed easily.  Psychiatric/Behavioral: Negative for confusion.     Physical Exam Updated Vital Signs BP 116/73 (BP Location: Right Arm)   Pulse (!) 122   Temp 100.6 F (38.1 C) (Oral)   Resp 16   Ht 5\' 4"  (1.626 m)   Wt 65.8 kg   LMP 11/26/2015  Comment: estimate  SpO2 97%   BMI 24.89 kg/m   Physical Exam  Constitutional: She appears well-developed and well-nourished. No distress.  HENT:  Nose: Nose normal.  Mouth/Throat: Oropharynx is clear and moist.  Eyes: Conjunctivae are normal. No scleral icterus.  Neck: Neck supple. No tracheal deviation present.  No stiffness or rigidity  Cardiovascular: Normal rate, regular rhythm, normal heart sounds and intact distal pulses.   Pulmonary/Chest: Effort normal and breath sounds normal. No respiratory distress.  Abdominal: Soft. Normal appearance and bowel sounds are normal. She exhibits no distension. There is no tenderness.    Genitourinary:  Genitourinary Comments: No cva tenderness  Musculoskeletal: She exhibits no edema.  Neurological: She is alert.  Speech clear/fluent. Ambulates w steady gait.   Skin: Skin is warm and dry. No rash noted. She is not diaphoretic.  Psychiatric: She has a normal mood and affect.  Nursing note and vitals reviewed.    ED Treatments / Results  Labs (all labs ordered are listed, but only abnormal results are displayed) Labs Reviewed - No data to display  EKG  EKG Interpretation None       Radiology No results found.  Procedures Procedures (including critical care time)  Medications Ordered in ED Medications - No data to display   Initial Impression / Assessment and Plan / ED Course  I have reviewed the triage vital signs and the nursing notes.  Pertinent labs & imaging results that were available during my care of the patient were reviewed by me and considered in my medical decision making (see chart for details).  Iv ns bolus. zofran iv.  Reviewed nursing notes and prior charts for additional history.   Trial of po fluids.   Tolerating po fluids.  Feels improved.  Patient currently appears stable for d/c.  Symptoms/exam felt most c/w influenza like illness/viral illness.     Final Clinical Impressions(s) / ED Diagnoses   Final diagnoses:  None    New Prescriptions New Prescriptions   No medications on file     Cathren Laine, MD 05/30/16 [phone]

## 2016-05-30 NOTE — ED Notes (Signed)
Pt taking po fluids and tolerating well. Pt eating crackers that she brought from home.

## 2016-05-30 NOTE — Discharge Instructions (Signed)
It was our pleasure to provide your ER care today - we hope that you feel better.  Rest. Drink adequate fluids.  Take zofran as need for nausea.  Take tylenol/motrin as need for fever and body aches.  Follow up with primary care doctor in 1 week if symptoms fail to improve/resolve.  Return to ER if worse, persistent vomiting, severe abdominal pain, trouble breathing, other concern.

## 2016-05-30 NOTE — ED Triage Notes (Signed)
Pt to ER by private vehicle with complaint of sore throat, cough and generalized body aches onset yesterday. Upon awakening this morning reports nausea and emesis x3. No medical hx. VSS. A/o x4.

## 2016-07-08 ENCOUNTER — Encounter: Payer: Self-pay | Admitting: Obstetrics & Gynecology

## 2016-07-08 ENCOUNTER — Encounter (HOSPITAL_COMMUNITY): Payer: Self-pay

## 2016-07-08 ENCOUNTER — Inpatient Hospital Stay (HOSPITAL_COMMUNITY)
Admission: AD | Admit: 2016-07-08 | Discharge: 2016-07-08 | Disposition: A | Discharge status: Home / Self Care | Payer: 59 | Source: Ambulatory Visit | Attending: Family Medicine | Admitting: Family Medicine

## 2016-07-08 DIAGNOSIS — N764 Abscess of vulva: Secondary | ICD-10-CM | POA: Diagnosis not present

## 2016-07-08 DIAGNOSIS — N926 Irregular menstruation, unspecified: Secondary | ICD-10-CM | POA: Diagnosis present

## 2016-07-08 DIAGNOSIS — Z87891 Personal history of nicotine dependence: Secondary | ICD-10-CM | POA: Insufficient documentation

## 2016-07-08 DIAGNOSIS — Z885 Allergy status to narcotic agent status: Secondary | ICD-10-CM | POA: Insufficient documentation

## 2016-07-08 LAB — URINALYSIS, ROUTINE W REFLEX MICROSCOPIC
BILIRUBIN URINE: NEGATIVE
GLUCOSE, UA: NEGATIVE mg/dL
HGB URINE DIPSTICK: NEGATIVE
KETONES UR: NEGATIVE mg/dL
NITRITE: NEGATIVE
PROTEIN: NEGATIVE mg/dL
Specific Gravity, Urine: 1.019 (ref 1.005–1.030)
pH: 7 (ref 5.0–8.0)

## 2016-07-08 LAB — POCT PREGNANCY, URINE: Preg Test, Ur: NEGATIVE

## 2016-07-08 MED ORDER — HYDROCODONE-ACETAMINOPHEN 5-325 MG PO TABS: 1.0000 | ORAL_TABLET | Freq: Four times a day (QID) | ORAL | 0 refills | Status: DC | PRN

## 2016-07-08 MED ORDER — SULFAMETHOXAZOLE-TRIMETHOPRIM 800-160 MG PO TABS: 1.0000 | ORAL_TABLET | Freq: Two times a day (BID) | ORAL | 0 refills | Status: DC

## 2016-07-08 MED ORDER — IBUPROFEN 600 MG PO TABS: 600.0000 mg | ORAL_TABLET | Freq: Four times a day (QID) | ORAL | 0 refills | Status: DC | PRN

## 2016-07-08 NOTE — MAU Provider Note (Signed)
History     CSN: [id number]  Arrival date and time: 07/08/16 1032   First Provider Initiated Contact with Patient 07/08/16 1106      Chief Complaint  Patient presents with  . Vaginal Pain   HPI [name] is a 26 y.o. G3P0030 who presents to MAU today with complaint of a painful "cyst" on her labia and irregular period. She states that she noted a painful area on her labia yesterday. Today it was slightly larger and more painful. She has not taken anything for pain. There is no current drainage or vaginal bleeding. She denies fever. She also states that she has had 3 SABs in the past. She has a history of irregular periods, but it has never been almost 2 months late like it is this time. The patient is sexually active with 1 partner and uses condoms regularly. She states issues with birth controls in the past.   OB History    Gravida Para Term Preterm AB Living   3       3 0   SAB TAB Ectopic Multiple Live Births   3              Past Medical History:  Diagnosis Date  . Anemia   . History of multiple miscarriages   . Recurrent UTI     History reviewed. No pertinent surgical history.  History reviewed. No pertinent family history.  Social History  Substance Use Topics  . Smoking status: Former Smoker    Quit date: 05/11/2016  . Smokeless tobacco: Never Used  . Alcohol use Yes    Allergies:  Allergies  Allergen Reactions  . Percocet [Oxycodone-Acetaminophen] Itching and Nausea And Vomiting    Prescriptions Prior to Admission  Medication Sig Dispense Refill Last Dose  . metroNIDAZOLE (FLAGYL) 500 MG tablet Take 1 tablet (500 mg total) by mouth 2 (two) times daily. 14 tablet 0 01/21/2016 at Unknown time  . ondansetron (ZOFRAN ODT) 8 MG disintegrating tablet Take 1 tablet (8 mg total) by mouth every 8 (eight) hours as needed for nausea or vomiting. 10 tablet 0   . oxyCODONE-acetaminophen (PERCOCET/ROXICET) 5-325 MG tablet Take 1 tablet by mouth every 4 (four)  hours as needed. 20 tablet 0   . phenazopyridine (PYRIDIUM) 200 MG tablet Take 1 tablet (200 mg total) by mouth 3 (three) times daily. 6 tablet 0   . Prenat-Fe Poly-Methfol-FA-DHA (VITAFOL ULTRA) 29-0.6-0.4-200 MG CAPS Take 1 capsule by mouth daily. 30 capsule 11 01/21/2016 at Unknown time  . promethazine (PHENERGAN) 12.5 MG tablet Take 1 tablet (12.5 mg total) by mouth every 6 (six) hours as needed for nausea or vomiting. 30 tablet 4 01/20/2016 at Unknown time  . [DISCONTINUED] ibuprofen (ADVIL,MOTRIN) 600 MG tablet Take 1 tablet (600 mg total) by mouth every 6 (six) hours as needed. 30 tablet 1     Review of Systems  Constitutional: Negative for fever.  Gastrointestinal: Negative for abdominal pain, constipation, diarrhea, nausea and vomiting.  Genitourinary: Positive for vaginal pain. Negative for dysuria, frequency, urgency, vaginal bleeding and vaginal discharge.   Physical Exam   Blood pressure 123/83, pulse 64, temperature 98.3 F (36.8 C), temperature source Oral, resp. rate 18, height 5' 4.17" (1.63 m), weight 157 lb 1.9 oz (71.3 kg), last menstrual period 04/26/2016, SpO2 100 %.  Physical Exam  Nursing note and vitals reviewed. Constitutional: She is oriented to person, place, and time. She appears well-developed and well-nourished. No distress.  HENT:  Head: Normocephalic and  atraumatic.  Cardiovascular: Normal rate.   Respiratory: Effort normal.  GI: Soft. She exhibits no distension.  Genitourinary: There is tenderness (small area of edema with mild erythema and mild fluctance noted. tender to touch) on the right labia. There is no rash or lesion on the right labia. There is no rash, tenderness or lesion on the left labia. Vaginal discharge (scant, white) found.  Neurological: She is alert and oriented to person, place, and time.  Skin: Skin is warm and dry. No erythema.  Psychiatric: She has a normal mood and affect.    Results for orders placed or performed during the  hospital encounter of 07/08/16 (from the past 24 hour(s))  Pregnancy, urine POC     Status: None   Collection Time: 07/08/16 11:02 AM  Result Value Ref Range   Preg Test, Ur NEGATIVE NEGATIVE    MAU Course  Procedures None   MDM UPT - negative Discussed options of I&D vs antibiotics and sitz baths. Patient would prefer to try antibiotics and sitz baths at this time.   Assessment and Plan  A: Negative pregnancy test Irregular periods  Labial abscess  P: Discharge home Rx for Bactrim and Vicodin given to patient  Warning signs for worsening condition discussed Discussed sitz baths and warm compresses to the affected area Patient referred to [facility] for further evaluation and management of irregular periods and recurrent SABs Patient may return to MAU as needed or if her condition were to change or worsen   Marny Lowenstein, PA-C  07/08/2016, 11:25 AM

## 2016-07-08 NOTE — Discharge Instructions (Signed)

## 2016-07-08 NOTE — MAU Note (Signed)
Pt reports missed period. Has a cyst on the inner wall of her labia, not sure if it's an ingrown hair.

## 2016-08-11 ENCOUNTER — Encounter: Payer: 59 | Admitting: Obstetrics & Gynecology

## 2016-10-26 ENCOUNTER — Encounter (HOSPITAL_COMMUNITY): Payer: Self-pay | Admitting: *Deleted

## 2016-10-26 ENCOUNTER — Inpatient Hospital Stay (HOSPITAL_COMMUNITY)
Admission: AD | Admit: 2016-10-26 | Discharge: 2016-10-26 | Disposition: A | Discharge status: Home / Self Care | Payer: 59 | Source: Ambulatory Visit | Attending: Obstetrics & Gynecology | Admitting: Obstetrics & Gynecology

## 2016-10-26 DIAGNOSIS — Z3202 Encounter for pregnancy test, result negative: Secondary | ICD-10-CM | POA: Diagnosis not present

## 2016-10-26 DIAGNOSIS — Z113 Encounter for screening for infections with a predominantly sexual mode of transmission: Secondary | ICD-10-CM

## 2016-10-26 DIAGNOSIS — Z885 Allergy status to narcotic agent status: Secondary | ICD-10-CM | POA: Diagnosis not present

## 2016-10-26 DIAGNOSIS — F17210 Nicotine dependence, cigarettes, uncomplicated: Secondary | ICD-10-CM | POA: Diagnosis not present

## 2016-10-26 DIAGNOSIS — N939 Abnormal uterine and vaginal bleeding, unspecified: Secondary | ICD-10-CM | POA: Diagnosis present

## 2016-10-26 HISTORY — DX: Anxiety disorder, unspecified: F41.9

## 2016-10-26 LAB — WET PREP, GENITAL
Clue Cells Wet Prep HPF POC: NONE SEEN
Sperm: NONE SEEN
Trich, Wet Prep: NONE SEEN
Yeast Wet Prep HPF POC: NONE SEEN

## 2016-10-26 LAB — URINALYSIS, ROUTINE W REFLEX MICROSCOPIC
BILIRUBIN URINE: NEGATIVE
Bacteria, UA: NONE SEEN
Glucose, UA: NEGATIVE mg/dL
KETONES UR: NEGATIVE mg/dL
LEUKOCYTES UA: NEGATIVE
NITRITE: NEGATIVE
PROTEIN: NEGATIVE mg/dL
SPECIFIC GRAVITY, URINE: 1.018 (ref 1.005–1.030)
pH: 7 (ref 5.0–8.0)

## 2016-10-26 LAB — POCT PREGNANCY, URINE: Preg Test, Ur: NEGATIVE

## 2016-10-26 NOTE — MAU Note (Signed)
I started having breast tenderness 1-2 days ago.  Bleeding(spotting) with cramps started to @ 1000.  Nausea for 1-2 days on and off Spotting dark red.

## 2016-10-26 NOTE — Discharge Instructions (Signed)
[facility] [employer] for [employer] at [facility]       Phone: [phone]  Center for [employer] at [place] Phone: [phone]  Center for [employer] at [place]  Phone: [phone]  Center for [employer] Healthcare at [employer]  Phone: [phone]  Center for [facility] at [place]  Phone: [phone]  [place] [facility]       Phone: [phone]  [facility]    Phone: [phone]   [facility] [place])    Phone: [phone]  [facility]    Phone: [phone]  [facility]    Phone: [phone]  [facility] Women's Healthcare    Phone: [phone]  [facility] Health Department-Family Planning       Phone: [phone]   [facility] Health Department-Maternity  Phone: [phone]  [facility]    Phone: [phone]  [facility] [place]   Phone: [phone]  Planned Parenthood      Phone: [phone]  [facility] Infertility    Phone: [phone]   [facility] Guide (Revised August 2014)     No Primary Care Doctor:  To locate a primary care doctor that accepts your insurance or provides certain services:           [place] Connect: [phone]           Physician Referral Service: [phone] ask for My [place]  If no insurance, you need to see if you qualify for [facility] orange card, call to set      up appointment for eligibility/enrollment at [phone] or [phone] or visit [facility] of Health and [employer] ([address], [place] and [address] -[place]) to meet with a [facility] enrollment specialist.  Agencies that provide inexpensive (sliding fee scale) medical care:       [facility] - Family Medicine at [place] - [phone]     [facility]  -   [facility] [phone]     [facility] Internal Medicine - [phone]     [facility] - [phone]     [facility] for Children [phone]     [facility] Family Practice [phone]  [facility] - [facility] Child Health @ [place] [phone]  [facility] - [facility] Health @ [place] - [phone]  [facility] Family Practice: [phone]   Women's Clinic: [phone]   Planned Parenthood: [phone]   [facility] of the [place]    Medicaid-accepting [facility] Providers:           [facility] [phone] (No Family Planning accepted)          [address], [phone], Mon-Fri 9am-5pm          [facility] - [phone]  [address] [place], [address] [place], Mon-Thursday 8am-5pm, Fri 8am-noon  [employer] - [phone]          [address], Mon-Fri 7:30am-4:30pm          [facility] - [phone]          [address] [place] 8am-5pm          [facility] [phone]  [address]          Only accepts [place] Goldman Sachsccess Medicaid patients after they have their name applied to their card  Self Pay (no insurance) in [facility] [place] [place]:           Sickle Cell Patients:   [address], [phone] [facility] Internal Medicine:  [address], [place] [phone]       [facility] Community Health and Wellness  [address], [place] [phone]  [facility]:  [address], [phone]          [facility]           [address], [phone] [facility] for Children  [address], [phone]           [facility] [place]           [address], [place] [phone]         [facility] - [address], [address]           [phone], Mon-Fri 9am-7pm, [place] 9am-1pm          [facility] - Family Medicine @ [facility]          [address], [phone]          [facility] - [facility] [place] [place]           [address], [phone] [facility] - [facility] Child Health - [place]  [address], [place] [phone]          [facility]           [address] [place] [address], [phone]  [facility] - [facility]   [address], [phone] [facility] - [facility] Child Health  [address], [phone]  Dr. Julio Sickssei-Bonsu           [address], [place], [phone]          [facility]           [address], [phone]          [facility] [place]             [address], [phone]          [facility]           [address] [place], [phone], 1st & 3rd Saturday every month, 10am-1pm  OTHERS:  Control and instrumentation engineeraith Action  (Immigration Lehman Brothersccess Clinic Only)  [phone] (Thursday only)  Strategies for finding a Primary Care Provider:  1) Find a Doctor and Pay Out of Pocket  Although you won't have to find out who is covered by your insurance plan, it is a good idea to ask around and get recommendations. You will then need to call the office and see if the doctor you have chosen will accept you as a new patient and what types of options they offer for patients who are self-pay. Some doctors offer discounts or will set up payment plans for their patients who do not have insurance, but you will need to ask so you aren't surprised when you get to your appointment.  2) Contact [facility]  Network - To see if you qualify for orange card access to healthcare safety net providers.  Call for appointment for eligibility/enrollment at  [phone] or [phone]. (Uninsured, 0-200% FPL, qualifying info)  Applicants for [facility] are first required to see if they are eligible to enroll in the [facility] Marketplace before enrolling in [facility] (and get an exemption if they are not).  GCCN Criteria for acceptance is:  ? Proof of ACA Marketing exemption - form or documentation  ? Valid photo ID (driver's license, state identification card, passport, home country ID)  ? Proof of [facility] residency (e.g. drivers license, lease/landlord information, pay stubs with address, utility bill, bank statement, etc.)  ? Proof of income (1040, last year's tax return, W2, 4 current pay stubs, other income proof)  ? Proof of assets (current bank statement + 3 most recent, disability paperwork, life insurance info, tax value on autos, etc.)  3) Contact Your Local Health Department  Not all health departments have doctors that can see patients for sick visits, but many do, so it is worth a call to see if yours does. If you don't know where your local health department is, you can check in your phone book. The CDC also has a tool to help you locate your state's health department, and many state websites also have listings of all of their local health departments.  4) Find a Walk-in Clinic  If your illness is not likely to be very severe or complicated, you may want to try a walk in clinic. These are popping up all over the country in pharmacies, drugstores, and shopping centers. They're usually staffed by nurse practitioners or physician assistants that have been trained to treat common illnesses and complaints. They're usually fairly quick and inexpensive. However, if you have serious medical issues or chronic medical problems, these are probably not your best option   STD Testing:           [facility] Department of [facility] [place], [place] Clinic           [address], [place], phone [phone] or [phone]           Monday -  Friday, call for an appointment          [facility] Department of [facility] [place], [place] Clinic           [address], [place], phone [phone] or [phone]           Monday - Friday, call for an appointment Abuse/Neglect:           [facility] Child Abuse Hotline: 223-350-9001947-299-4567           [facility] Child Abuse Hotline: [phone] (After Hours)  Emergency Shelter:  [facility] [phone](336) [phone]  [facility] [place]- [phone]) [phone]  [facility] [place] - [phone](336) [phone]  Youth Focus - Act Together - [phone]) [phone] (ages 5311-17)  Homeless Day Shelter @ [employer] [facility] - [phone]) [phone]   Mammograms - Free at [facility] - [place] [phone]- [phone]  Maternity Homes:           Room at the [place] of the [facility]: [phone]   (Homeless mother with children)          Rebeca AlertFlorence [facility]itt[facility] Services: [phone]) [phone] (Mothers only)  Youth Focus: [phone] (Pregnant 3516-850 years old)  Adopt a Mom -([phone]) [phone]  [facility] Resources   [facility] - [facility] F.  [place]  [address] [phone]          [facility] [place]           [address] [place]           [phone]          [place]           [address], [place]           [phone]          [facility] Dept.           [address], [place]           [phone]          [facility] Mental Health           [phone]          [facility]           [phone]          [facility] in [place]           [address]           5397728157, [facility] Child Abuse Hotline           [phone]           [phone] (After Hours)  Behavioral Health Services /Substance Abuse Resources:           Alcohol and Drug Services: [phone]           Addiction Recovery Care  Associates: [phone]          [facility]: [phone]   Narcotics Helpline - (717)[phone]          [employer]: [phone]           Residential & Outpatient Substance Abuse Program - [facility] [place]: [phone]  [employer]  Behavioral Health and Wellness Center - [phone] Psychological Services:          [facility] Reasons Health: [phone]   Therapeutic Alternatives: [phone]          [facility] Mental Health           [address] [place]           ACCESS LINE: [phone]     (24 Hour)  Mobile Crisis:   HELPLINES:  Financial risk analyst on Mental Illness - [place] [phone] [facility] on Mental Illness - [place] [phone]  Walk In [facility] - [address] - [place]  [phone]       [facility] - [phone] or [phone]  [facility] - [phone] [address] - [employer] [phone]  [facility] [phone]. [address], [place] [phone]   Dental Assistance:  If unable to pay or uninsured, contact: [facility]. to become qualified for the adult dental clinic. Patient must be enrolled in [facility] (uninsured, 0-200% FPL, qualifying info).  Enroll in [facility] first, then see Primary Care Physician assigned to you, the PCP makes a dental referral. [place] [facility] Access Program will receive referral and contacts patient for appointment.  Patients with Medicaid           [address] [phone]  [place]  Dental (Children up to 20 + Pregnant Women) - [phone]  [facility] - [address] - [phone]  If unable to pay, or uninsured: contact [facility] Department [phone] in [place] - (Children only + Pregnant Women), [phone] in [facility] Children only) to become qualified for the adult dental clinic  Must see if eligible to enroll in [facility]  Marketplace before enrolling into the [facility] (exemption required) [phone] for an appointment)  [email or web address];   [phone].  If not eligible for ACA, then go by Department of Health and Human Services to see if eligible for orange card.  [address] [place] and [phone]  Once you get an orange card, you will have a Primary Care home who will then refer you to dental if needed.        Other IT consultant:   [facility] [phone] (ext [phone])   [address]  Dr. Lawrence [place]se[place] - [phone]   [address]    [place] - [phone]   [facility]           [address] [place], [place], [place], [address]           [phone], Ext. [phone]           2nd and 4th Thursday of the month at 6:30am (Simple extractions only - no wisdom teeth or surgery) First come/First serve -First 10 clients served           [facility] [place], [place] and [place] residents only)          [address] [place], [place], [place], [address]           [phone]                    [facility] Health Department           [phone]          [facility] Health Department          [phone]         [facility] Health Department - [facility]          [phone]   Transportation Options:  Ambulance - 911 - [phone] per ride Family Member to accompany patient (if stable) [place] Transit Authority - [phone]  PART - [phone]  Taxi - [phone] - [place]  SCAT - [phone] (Application required)  [facility] - [phone]

## 2016-10-26 NOTE — MAU Provider Note (Signed)
History    CSN: [id number]  Arrival date and time: 10/26/16 1527   First Provider Initiated Contact with Patient 10/26/16 1600     Chief Complaint  Patient presents with  . Vaginal Bleeding  . Abdominal Cramping  . breast tenderness   HPI Samantha Jimenez is a 26 y.o. G3P0030 non pregnant female who presents with vaginal spotting and cramping. She states she had unprotected intercourse last Sunday, took Plan B and the cramping and spotting started yesterday 8/4. She is also reporting breast tenderness. LMP 10/12/16. She has one partner and does not use any form of birth control. She is requesting testing for STDs.  OB History    Gravida Para Term Preterm AB Living   3       3 0   SAB TAB Ectopic Multiple Live Births   3              Past Medical History:  Diagnosis Date  . Anemia   . Anxiety   . History of multiple miscarriages   . Recurrent UTI     History reviewed. No pertinent surgical history.  History reviewed. No pertinent family history.  Social History  Substance Use Topics  . Smoking status: Current Every Day Smoker    Types: Cigarettes    Last attempt to quit: 05/11/2016  . Smokeless tobacco: Never Used     Comment: 1 pack per week  . Alcohol use 2.4 oz/week    4 Glasses of wine per week    Allergies:  Allergies  Allergen Reactions  . Percocet [Oxycodone-Acetaminophen] Itching and Nausea And Vomiting    Prescriptions Prior to Admission  Medication Sig Dispense Refill Last Dose  . HYDROcodone-acetaminophen (NORCO/VICODIN) 5-325 MG tablet Take 1-2 tablets by mouth every 6 (six) hours as needed for moderate pain. 12 tablet 0   . ibuprofen (ADVIL,MOTRIN) 600 MG tablet Take 1 tablet (600 mg total) by mouth every 6 (six) hours as needed. 30 tablet 0   . ondansetron (ZOFRAN ODT) 8 MG disintegrating tablet Take 1 tablet (8 mg total) by mouth every 8 (eight) hours as needed for nausea or vomiting. 10 tablet 0   . Prenat-Fe Poly-Methfol-FA-DHA (VITAFOL ULTRA)  29-0.6-0.4-200 MG CAPS Take 1 capsule by mouth daily. 30 capsule 11 01/21/2016 at Unknown time  . promethazine (PHENERGAN) 12.5 MG tablet Take 1 tablet (12.5 mg total) by mouth every 6 (six) hours as needed for nausea or vomiting. 30 tablet 4 01/20/2016 at Unknown time  . sulfamethoxazole-trimethoprim (BACTRIM DS,SEPTRA DS) 800-160 MG tablet Take 1 tablet by mouth 2 (two) times daily. 14 tablet 0     Review of Systems  Constitutional: Negative.  Negative for chills and fever.  HENT: Negative.   Respiratory: Negative.  Negative for shortness of breath.   Cardiovascular: Negative.  Negative for chest pain.  Gastrointestinal: Positive for abdominal pain. Negative for constipation, diarrhea, nausea and vomiting.  Genitourinary: Positive for vaginal bleeding. Negative for dysuria and vaginal discharge.  Musculoskeletal:       Breast tenderness  Neurological: Negative.  Negative for dizziness and headaches.  Psychiatric/Behavioral: Negative.    Physical Exam   Blood pressure 110/77, pulse 95, temperature 98.1 F (36.7 C), temperature source Oral, resp. rate 18, SpO2 99 %.  Physical Exam  Nursing note and vitals reviewed. Constitutional: She is oriented to person, place, and time. She appears well-developed and well-nourished.  HENT:  Head: Normocephalic and atraumatic.  Eyes: Conjunctivae are normal. No scleral icterus.  Cardiovascular: Normal rate,  regular rhythm and normal heart sounds.   Respiratory: Effort normal and breath sounds normal. No respiratory distress.  GI: Soft. She exhibits no distension. There is no tenderness. There is no guarding.  Genitourinary: Vaginal discharge (scant brown mucous) found.  Neurological: She is alert and oriented to person, place, and time.  Skin: Skin is warm and dry.  Psychiatric: She has a normal mood and affect. Her behavior is normal. Judgment and thought content normal.   Pelvic exam: Cervix pink, visually closed, without lesion, vaginal  walls and external genitalia normal Bimanual exam: Cervix 0/long/high, firm, anterior, neg CMT, uterus nontender, nonenlarged, adnexa without tenderness, enlargement, or mass  MAU Course  Procedures Results for orders placed or performed during the hospital encounter of 10/26/16 (from the past 24 hour(s))  Urinalysis, Routine w reflex microscopic     Status: Abnormal   Collection Time: 10/26/16  3:29 PM  Result Value Ref Range   Color, Urine YELLOW YELLOW   APPearance CLEAR CLEAR   Specific Gravity, Urine 1.018 1.005 - 1.030   pH 7.0 5.0 - 8.0   Glucose, UA NEGATIVE NEGATIVE mg/dL   Hgb urine dipstick MODERATE (A) NEGATIVE   Bilirubin Urine NEGATIVE NEGATIVE   Ketones, ur NEGATIVE NEGATIVE mg/dL   Protein, ur NEGATIVE NEGATIVE mg/dL   Nitrite NEGATIVE NEGATIVE   Leukocytes, UA NEGATIVE NEGATIVE   RBC / HPF 0-5 0 - 5 RBC/hpf   WBC, UA 0-5 0 - 5 WBC/hpf   Bacteria, UA NONE SEEN NONE SEEN   Squamous Epithelial / LPF 0-5 (A) NONE SEEN   Mucous PRESENT   Pregnancy, urine POC     Status: None   Collection Time: 10/26/16  3:48 PM  Result Value Ref Range   Preg Test, Ur NEGATIVE NEGATIVE   MDM UA, UPT Wet prep and gc/chlamydia HIV, RPR Assessment and Plan   1. Vaginal spotting   2. Encounter for screening examination for sexually transmitted disease    -Discharge patient home in stable condition -Will call if any other labs are positive -Contraceptive choices and OB/GYN list given, encouraged patient to establish care  -Encouraged patient to use condoms  -Encouraged to return here or to other Urgent Care/ED if she develops worsening of symptoms, increase in pain, fever, or other concerning symptoms.   Cleone SlimCaroline Neill SNM 10/26/2016, 4:31 PM   I confirm that I have verified the information documented in the student midwife's note and that I have also personally performed the physical exam and all medical decision making activities.Contact information for [employer] behavioral health  for anxiety and information on establishing a primary care provider given to pt at her request today.  Sharen CounterLisa Leftwich-Kirby, CNM 8:48 PM

## 2016-10-27 LAB — RPR: RPR: NONREACTIVE

## 2016-10-27 LAB — GC/CHLAMYDIA PROBE AMP (~~LOC~~) NOT AT ARMC
Chlamydia: NEGATIVE
Neisseria Gonorrhea: NEGATIVE

## 2016-10-27 LAB — HIV ANTIBODY (ROUTINE TESTING W REFLEX): HIV SCREEN 4TH GENERATION: NONREACTIVE

## 2016-12-11 ENCOUNTER — Ambulatory Visit (HOSPITAL_COMMUNITY)
Admission: RE | Admit: 2016-12-11 | Discharge: 2016-12-11 | Disposition: A | Discharge status: Home / Self Care | Payer: 59 | Attending: Psychiatry | Admitting: Psychiatry

## 2016-12-11 DIAGNOSIS — F17210 Nicotine dependence, cigarettes, uncomplicated: Secondary | ICD-10-CM | POA: Diagnosis not present

## 2016-12-11 DIAGNOSIS — F331 Major depressive disorder, recurrent, moderate: Secondary | ICD-10-CM | POA: Diagnosis not present

## 2016-12-11 DIAGNOSIS — Z87440 Personal history of urinary (tract) infections: Secondary | ICD-10-CM | POA: Diagnosis not present

## 2016-12-11 DIAGNOSIS — Z7289 Other problems related to lifestyle: Secondary | ICD-10-CM | POA: Insufficient documentation

## 2016-12-11 DIAGNOSIS — Z885 Allergy status to narcotic agent status: Secondary | ICD-10-CM | POA: Diagnosis not present

## 2016-12-11 DIAGNOSIS — F411 Generalized anxiety disorder: Secondary | ICD-10-CM | POA: Insufficient documentation

## 2016-12-11 NOTE — H&P (Signed)
Behavioral Health Medical Screening Exam  Samantha Jimenez is an 26 y.o. female.  Total Time spent with patient: 45 minutes  Psychiatric Specialty Exam: Physical Exam  Constitutional: She is oriented to person, place, and time.  HENT:  Head: Normocephalic.  Neck: Normal range of motion.  Cardiovascular: Normal rate and regular rhythm.   Respiratory: Effort normal and breath sounds normal.  Musculoskeletal: Normal range of motion.  Neurological: She is alert and oriented to person, place, and time.  Skin: Skin is warm and dry.  Psychiatric: Her speech is normal and behavior is normal. Judgment and thought content normal. Her mood appears anxious. Cognition and memory are normal.    Review of Systems  Psychiatric/Behavioral: Negative for depression (Denies), hallucinations (denies), memory loss, substance abuse (denies) and suicidal ideas (denies). The patient is nervous/anxious (today 10/10). The patient does not have insomnia (denies).   All other systems reviewed and are negative.   Blood pressure 126/81, pulse 78, temperature 98.6 F (37 C), temperature source Oral, resp. rate 16, SpO2 99 %.There is no height or weight on file to calculate BMI.  General Appearance: Casual and Neat  Eye Contact:  Good  Speech:  Clear and Coherent and Normal Rate  Volume:  Normal  Mood:  Anxious  Affect:  Appropriate and Congruent  Thought Process:  Coherent and Goal Directed  Orientation:  Full (Time, Place, and Person)  Thought Content:  Logical  Suicidal Thoughts:  No  Homicidal Thoughts:  No  Memory:  Immediate;   Good Recent;   Good Remote;   Good  Judgement:  Intact  Insight:  Present  Psychomotor Activity:  Normal  Concentration: Concentration: Fair and Attention Span: Fair  Recall:  Good  Fund of Knowledge:Fair  Language: Good  Akathisia:  No  Handed:  Right  AIMS (if indicated):     Assets:  Communication Skills Desire for Improvement Financial  Resources/Insurance Housing Physical Health Resilience Social Support Transportation  Sleep:       Musculoskeletal: Strength & Muscle Tone: within normal limits Gait & Station: normal Patient leans: N/A  Blood pressure 126/81, pulse 78, temperature 98.6 F (37 C), temperature source Oral, resp. rate 16, SpO2 99 %.  Assessment: Patient reports that she has had a history of anxiety (panic attacks) which has been getting worse.  Reports a bad episode today at work but unsure of what the cause may be.  States that prior history of cutting in middle school but none since.  Denies suicidal/homicidal ideation, psychosis, and paranoia.  Denies prior history of suicide attempt.  With worsening anxiety also having mood swings, and irritability.  Wanting outpatient services because getting worse.   Consulted with Dr Lucianne Muss  Recommendations: Intensive outpatient and assistance/resources for outpatient psychiatric services Based on my evaluation the patient does not appear to have an emergency medical condition.  Samantha Pawling, NP 12/11/2016, 6:29 PM
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
## 2016-12-12 ENCOUNTER — Telehealth (HOSPITAL_COMMUNITY): Payer: Self-pay | Admitting: Psychiatry

## 2016-12-15 ENCOUNTER — Ambulatory Visit (INDEPENDENT_AMBULATORY_CARE_PROVIDER_SITE_OTHER): Payer: 59 | Admitting: Family Medicine

## 2016-12-15 ENCOUNTER — Encounter: Payer: Self-pay | Admitting: Family Medicine

## 2016-12-15 VITALS — BP 128/78 | HR 84 | Temp 98.5°F | Resp 18 | Ht 64.57 in | Wt 158.4 lb

## 2016-12-15 DIAGNOSIS — F410 Panic disorder [episodic paroxysmal anxiety] without agoraphobia: Secondary | ICD-10-CM | POA: Insufficient documentation

## 2016-12-15 DIAGNOSIS — F411 Generalized anxiety disorder: Secondary | ICD-10-CM | POA: Diagnosis not present

## 2016-12-15 MED ORDER — LORAZEPAM 0.5 MG PO TABS: 0.5000 mg | ORAL_TABLET | Freq: Two times a day (BID) | ORAL | 0 refills | Status: DC | PRN

## 2016-12-15 MED ORDER — HYDROXYZINE PAMOATE 50 MG PO CAPS: 50.0000 mg | ORAL_CAPSULE | Freq: Every day | ORAL | 3 refills | Status: DC

## 2016-12-15 MED ORDER — FLUOXETINE HCL 20 MG PO TABS: 20.0000 mg | ORAL_TABLET | Freq: Every day | ORAL | 3 refills | Status: DC

## 2016-12-15 NOTE — Patient Instructions (Signed)
     IF you received an x-ray today, you will receive an invoice from [facility]. Please contact [facility] at [phone] with questions or concerns regarding your invoice.   IF you received labwork today, you will receive an invoice from [facility]. Please contact [facility] at [phone] with questions or concerns regarding your invoice.   Our billing staff will not be able to assist you with questions regarding bills from these companies.  You will be contacted with the lab results as soon as they are available. The fastest way to get your results is to activate your My Chart account. Instructions are located on the last page of this paperwork. If you have not heard from us regarding the results in 2 weeks, please contact this office.     

## 2016-12-15 NOTE — Progress Notes (Signed)
9/24/20186:18 PM  Gregoria Selvy [birth year], 26 y.o. female [id number]  Chief Complaint  Patient presents with  . Anxiety    hard to focus   . Insomnia  . Panic Attack    at work   . Depression    screening was a 35     HPI:   Patient is a 26 y.o. female who presents today to discuss medication management for her anxiety and panic attacks.   She has been struggling with this for about 3 years, lost her job at time of onset due to panic attacks and inability to perform her job well. She states that a lot has happened prior to her anxiety significantly worsening with her marriage among other things in life. After finally realizing this is "not normal", accepting that she has taken on some unhealthy behaviors trying to "relax" and just tired of feeling this way all the time she reached out to counseling. She has completed her assessment and sees her therapist for intensive outpatient counseling in 2 weeks.   She come today to request medication. She has never been on mental health meds before. She has never been hospitalized. She denies h/o suicide attempts or current SI.    Depression screen PHQ 2/9 12/15/2016  Decreased Interest 1  Down, Depressed, Hopeless 1  PHQ - 2 Score 2  Altered sleeping 3  Tired, decreased energy 3  Change in appetite 3  Feeling bad or failure about yourself  0  Trouble concentrating 3  Moving slowly or fidgety/restless 3  Suicidal thoughts 0  PHQ-9 Score 17   GAD 7 : Generalized Anxiety Score 12/15/2016  Nervous, Anxious, on Edge 3  Control/stop worrying 3  Worry too much - different things 3  Trouble relaxing 3  Restless 3  Easily annoyed or irritable 3  Afraid - awful might happen 3  Total GAD 7 Score 21    Allergies  Allergen Reactions  . Percocet [Oxycodone-Acetaminophen] Itching and Nausea And Vomiting    No current outpatient prescriptions on file prior to visit.   No current facility-administered medications on file prior to  visit.     Past Medical History:  Diagnosis Date  . Anemia   . Anxiety   . History of multiple miscarriages   . Recurrent UTI     History reviewed. No pertinent surgical history.  Social History  Substance Use Topics  . Smoking status: Current Every Day Smoker    Types: Cigarettes    Last attempt to quit: 05/11/2016  . Smokeless tobacco: Never Used     Comment: 1 pack per week  . Alcohol use 2.4 oz/week    4 Glasses of wine per week    Family History  Problem Relation Age of Onset  . Hypertension Mother   . Hypertension Maternal Grandmother     Review of Systems  Constitutional: Negative for chills and fever.  Respiratory: Negative for cough and shortness of breath.   Cardiovascular: Negative for chest pain, palpitations and leg swelling.  Gastrointestinal: Negative for abdominal pain, nausea and vomiting.     OBJECTIVE:  Blood pressure 128/78, pulse 84, temperature 98.5 F (36.9 C), temperature source Oral, resp. rate 18, height 5' 4.57" (1.64 m), weight 158 lb 6.4 oz (71.8 kg), SpO2 96 %.  Physical Exam  Constitutional: She is oriented to person, place, and time and well-developed, well-nourished, and in no distress. No distress.  HENT:  Head: Normocephalic and atraumatic.  Mouth/Throat: Oropharynx is clear and moist.  Eyes: Pupils are equal, round, and reactive to light. Conjunctivae and EOM are normal.  Neck: Neck supple.  Pulmonary/Chest: Effort normal.  Neurological: She is alert and oriented to person, place, and time. Gait normal.  Skin: Skin is warm and dry. She is not diaphoretic.  Psychiatric: Memory and judgment normal. Her mood appears anxious. Her affect is labile.    No results found for this or any previous visit (from the past 24 hour(s)).  No results found.   ASSESSMENT and PLAN  1. Generalized anxiety disorder with panic attacks  Discussed treatment options, new meds r/se/b. Importance of counseling. Strongly encouraged exercise and  finding more healthier outlets.   - FLUoxetine (PROZAC) 20 MG tablet; Take 1 tablet (20 mg total) by mouth daily. - hydrOXYzine (VISTARIL) 50 MG capsule; Take 1 capsule (50 mg total) by mouth at bedtime. - LORazepam (ATIVAN) 0.5 MG tablet; Take 1 tablet (0.5 mg total) by mouth 2 (two) times daily as needed (for severe panic attacks).   Return in about 2 weeks (around 12/29/2016).    Myles Lipps, MD Primary Care at [facility] [address] [place], [place] [address] Ph.  [phone] Fax [phone]

## 2016-12-29 ENCOUNTER — Ambulatory Visit: Payer: 59 | Admitting: Family Medicine

## 2016-12-30 ENCOUNTER — Encounter: Payer: Self-pay | Admitting: Family Medicine

## 2016-12-30 ENCOUNTER — Ambulatory Visit (INDEPENDENT_AMBULATORY_CARE_PROVIDER_SITE_OTHER): Payer: 59 | Admitting: Family Medicine

## 2016-12-30 VITALS — BP 108/66 | HR 105 | Temp 98.7°F | Wt 157.0 lb

## 2016-12-30 DIAGNOSIS — N96 Recurrent pregnancy loss: Secondary | ICD-10-CM | POA: Diagnosis not present

## 2016-12-30 DIAGNOSIS — F410 Panic disorder [episodic paroxysmal anxiety] without agoraphobia: Secondary | ICD-10-CM

## 2016-12-30 DIAGNOSIS — F411 Generalized anxiety disorder: Secondary | ICD-10-CM | POA: Diagnosis not present

## 2016-12-30 DIAGNOSIS — Z23 Encounter for immunization: Secondary | ICD-10-CM | POA: Diagnosis not present

## 2016-12-30 MED ORDER — FLUOXETINE HCL 40 MG PO CAPS: 40.0000 mg | ORAL_CAPSULE | Freq: Every day | ORAL | 3 refills | Status: DC

## 2016-12-30 MED ORDER — HYDROXYZINE HCL 10 MG PO TABS: ORAL_TABLET | ORAL | 0 refills | Status: DC

## 2016-12-30 NOTE — Progress Notes (Signed)
10/9/201810:15 AM  Samantha Jimenez 12-28-1990, 26 y.o. female [id number]  Chief Complaint  Patient presents with  . medication followup    fluoxentine, lorazepam hydroxyzine    HPI:   Patient is a 26 y.o. female who presents today for fu on anxiety.   She reports taking fluoxetine  BID and hydroxyzine  at bedtime mostly during the weekends. She reports at first that she felt a bit blunted emotionally from the fluoxetine but now she has gotten used to it and its not an issue. She is very happy with results and significantly improved anxiety. She feels the hydroxyzine is way too strong and cant really take it during the week as she wakes up with a hangover. She reports still waking up around 2-3 am and having difficulty returning to sleep.   She would also like to discuss starting workup for recurring miscarriage. She reports three 1T documented miscarriages, 10/2013, summer 2016, 12/2015.  Reports irregular heavy periods. G3P0.  GAD 7 : Generalized Anxiety Score 12/30/2016 12/15/2016  Nervous, Anxious, on Edge 0 3  Control/stop worrying 0 3  Worry too much - different things 0 3  Trouble relaxing 0 3  Restless 0 3  Easily annoyed or irritable 0 3  Afraid - awful might happen 0 3  Total GAD 7 Score 0 21     Depression screen [facility] 2/9 12/30/2016 12/15/2016  Decreased Interest 3 1  Down, Depressed, Hopeless 0 1  PHQ - 2 Score 3 2  Altered sleeping 0 3  Tired, decreased energy 3 3  Change in appetite 3 3  Feeling bad or failure about yourself  0 0  Trouble concentrating 0 3  Moving slowly or fidgety/restless 0 3  Suicidal thoughts 0 0  PHQ-9 Score 9 17    Allergies  Allergen Reactions  . Percocet [Oxycodone-Acetaminophen] Itching and Nausea And Vomiting    Prior to Admission medications   Medication Sig Start Date End Date Taking? Authorizing Provider  LORazepam (ATIVAN) 0.5 MG tablet Take 1 tablet (0.5 mg total) by mouth 2 (two) times daily as needed (for severe  panic attacks). 12/15/16  Yes Myles Lipps, MD  FLUoxetine (PROZAC) 40 MG capsule Take 1 capsule (40 mg total) by mouth daily. 12/30/16   Myles Lipps, MD  hydrOXYzine (ATARAX/VISTARIL) 10 MG tablet 1-2 tablets at bedtime as needed 12/30/16   Myles Lipps, MD    Past Medical History:  Diagnosis Date  . Anemia   . Anxiety   . History of multiple miscarriages   . Recurrent UTI     History reviewed. No pertinent surgical history.  Social History  Substance Use Topics  . Smoking status: Current Every Day Smoker    Types: Cigarettes    Last attempt to quit: 05/11/2016  . Smokeless tobacco: Never Used     Comment: 1 pack per week  . Alcohol use 2.4 oz/week    4 Glasses of wine per week    Family History  Problem Relation Age of Onset  . Hypertension Mother   . Hypertension Maternal Grandmother     Review of Systems  Constitutional: Negative for chills and fever.  Respiratory: Negative for cough and shortness of breath.   Cardiovascular: Negative for chest pain, palpitations and leg swelling.  Gastrointestinal: Negative for abdominal pain, nausea and vomiting.     OBJECTIVE:  Blood pressure 108/66, pulse (!) 105, temperature 98.7 F (37.1 C), temperature source Oral, weight 157 lb (71.2 kg).  Physical Exam  Constitutional: She is oriented to person, place, and time and well-developed, well-nourished, and in no distress.  HENT:  Head: Normocephalic and atraumatic.  Mouth/Throat: Mucous membranes are normal.  Eyes: Pupils are equal, round, and reactive to light. EOM are normal. No scleral icterus.  Neck: Neck supple.  Pulmonary/Chest: Effort normal.  Neurological: She is alert and oriented to person, place, and time. Gait normal.  Skin: Skin is warm and dry.  Psychiatric: Mood and affect normal.  Nursing note and vitals reviewed.   ASSESSMENT and PLAN 1. Generalized anxiety disorder with panic attacks Patient doing significantly better, tolerating   well. Will change rx to  capsule to be taken in AM, BID dosing might be contributing to continued sleep issues. Also providing lower hydroxyzine dose which will be more likely better tolerated.  Meds ordered this encounter  Medications  . FLUoxetine (PROZAC) 40 MG capsule    Sig: Take 1 capsule (40 mg total) by mouth daily.    Dispense:  90 capsule    Refill:  3  . hydrOXYzine (ATARAX/VISTARIL) 10 MG tablet    Sig: 1-2 tablets at bedtime as needed    Dispense:  60 tablet    Refill:  0    2. Need for vaccination Given today - Flu Vaccine QUAD 36+ mos IM  3. History of multiple miscarriages Starting workup. Consider referral to Ob-Gyn. - TSH - Antiphospholipid Syndrome Comp - US PELVIC COMPLETE WITH TRANSVAGINAL; Future   Return in about 4 weeks (around 01/27/2017).    Myles Lipps, MD Primary Care at [facility] [address] [place], [place] [address] Ph.  [phone] Fax [phone]

## 2016-12-30 NOTE — Patient Instructions (Signed)
     IF you received an x-ray today, you will receive an invoice from [facility]. Please contact  Radiology at [phone] with questions or concerns regarding your invoice.   IF you received labwork today, you will receive an invoice from [facility]. Please contact [facility] at [phone] with questions or concerns regarding your invoice.   Our billing staff will not be able to assist you with questions regarding bills from these companies.  You will be contacted with the lab results as soon as they are available. The fastest way to get your results is to activate your My Chart account. Instructions are located on the last page of this paperwork. If you have not heard from us regarding the results in 2 weeks, please contact this office.     

## 2017-01-06 LAB — TSH: TSH: 1.22 u[IU]/mL (ref 0.450–4.500)

## 2017-01-06 LAB — ANTIPHOSPHOLIPID SYNDROME COMP
APTT: 27.8 s
Anticardiolipin Ab, IgA: <10 [APL'U]
Anticardiolipin Ab, IgG: <10 [GPL'U]
Anticardiolipin Ab, IgM: <10 [MPL'U]
Antiphosphatidylserine IgG: 0 {GPS'U}
Antiphosphatidylserine IgM: 4 {MPS'U}
Antiprothrombin Antibody, IgG: 8 G units
Beta-2 Glycoprotein I, IgA: <10 SAU
Beta-2 Glycoprotein I, IgG: <10 SGU
Beta-2 Glycoprotein I, IgM: <10 SMU
DRVVT Screen Seconds: 35.2 s
Hexagonal Phospholipid Neutral: 6 s
Platelet Neutralization: 0 s

## 2017-01-15 ENCOUNTER — Ambulatory Visit
Admission: RE | Admit: 2017-01-15 | Discharge: 2017-01-15 | Disposition: A | Discharge status: Home / Self Care | Payer: 59 | Source: Ambulatory Visit | Attending: Family Medicine | Admitting: Family Medicine

## 2017-01-15 DIAGNOSIS — N96 Recurrent pregnancy loss: Secondary | ICD-10-CM

## 2017-01-30 ENCOUNTER — Encounter: Payer: Self-pay | Admitting: Family Medicine

## 2017-01-30 ENCOUNTER — Ambulatory Visit (INDEPENDENT_AMBULATORY_CARE_PROVIDER_SITE_OTHER): Payer: 59 | Admitting: Family Medicine

## 2017-01-30 ENCOUNTER — Ambulatory Visit: Payer: 59 | Admitting: Family Medicine

## 2017-01-30 ENCOUNTER — Other Ambulatory Visit: Payer: Self-pay

## 2017-01-30 VITALS — BP 118/66 | HR 92 | Resp 16 | Ht 64.57 in | Wt 156.8 lb

## 2017-01-30 DIAGNOSIS — N914 Secondary oligomenorrhea: Secondary | ICD-10-CM | POA: Diagnosis not present

## 2017-01-30 DIAGNOSIS — N96 Recurrent pregnancy loss: Secondary | ICD-10-CM | POA: Diagnosis not present

## 2017-01-30 NOTE — Patient Instructions (Signed)
     IF you received an x-ray today, you will receive an invoice from [facility]. Please contact [facility] at [phone] with questions or concerns regarding your invoice.   IF you received labwork today, you will receive an invoice from [facility]. Please contact [facility] at [phone] with questions or concerns regarding your invoice.   Our billing staff will not be able to assist you with questions regarding bills from these companies.  You will be contacted with the lab results as soon as they are available. The fastest way to get your results is to activate your My Chart account. Instructions are located on the last page of this paperwork. If you have not heard from us regarding the results in 2 weeks, please contact this office.     

## 2017-01-31 ENCOUNTER — Ambulatory Visit: Payer: 59 | Admitting: Urgent Care

## 2017-01-31 DIAGNOSIS — N914 Secondary oligomenorrhea: Secondary | ICD-10-CM

## 2017-02-01 LAB — TESTOSTERONE: Testosterone: 38 ng/dL (ref 8–48)

## 2017-02-01 LAB — PROLACTIN: Prolactin: 14 ng/mL (ref 4.8–23.3)

## 2017-02-01 LAB — FOLLICLE STIMULATING HORMONE: FSH: 5.1 m[IU]/mL

## 2017-02-02 NOTE — Progress Notes (Signed)
11/12/20184:23 PM  Samantha Jimenez 1990/08/10, 26 y.o. female [id number]  Chief Complaint  Patient presents with  . Follow-up    HPI:   Patient is a 26 y.o. female who presents today for fu on labs and US done as initial workup for SAB x 3. Menses irregular. Denies any hirsutism. Has no acute concerns today.  Depression screen [facility] 2/9 01/30/2017 12/30/2016 12/15/2016  Decreased Interest 0 3 1  Down, Depressed, Hopeless 0 0 1  PHQ - 2 Score 0 3 2  Altered sleeping - 0 3  Tired, decreased energy - 3 3  Change in appetite - 3 3  Feeling bad or failure about yourself  - 0 0  Trouble concentrating - 0 3  Moving slowly or fidgety/restless - 0 3  Suicidal thoughts - 0 0  PHQ-9 Score - 9 17    Allergies  Allergen Reactions  . Percocet [Oxycodone-Acetaminophen] Itching and Nausea And Vomiting    Prior to Admission medications   Medication Sig Start Date End Date Taking? Authorizing Provider  FLUoxetine (PROZAC) 40 MG capsule Take 1 capsule (40 mg total) by mouth daily. 12/30/16  Yes Myles Lipps, MD  hydrOXYzine (ATARAX/VISTARIL) 10 MG tablet 1-2 tablets at bedtime as needed 12/30/16  Yes Myles Lipps, MD  LORazepam (ATIVAN) 0.5 MG tablet Take 1 tablet (0.5 mg total) by mouth 2 (two) times daily as needed (for severe panic attacks). 12/15/16  Yes Myles Lipps, MD    Past Medical History:  Diagnosis Date  . Anemia   . Anxiety   . History of multiple miscarriages   . Recurrent UTI     History reviewed. No pertinent surgical history.  Social History   Tobacco Use  . Smoking status: Current Every Day Smoker    Types: Cigarettes    Last attempt to quit: 05/11/2016    Years since quitting: 0.7  . Smokeless tobacco: Never Used  . Tobacco comment: 1 pack per week  Substance Use Topics  . Alcohol use: Yes    Alcohol/week: 2.4 oz    Types: 4 Glasses of wine per week    Family History  Problem Relation Age of Onset  . Hypertension Mother   . Hypertension  Maternal Grandmother     ROS Per hpi  OBJECTIVE:  Blood pressure 118/66, pulse 92, resp. rate 16, height 5' 4.57" (1.64 m), weight 156 lb 12.8 oz (71.1 kg), SpO2 99 %.  Physical Exam  Constitutional: She is oriented to person, place, and time and well-developed, well-nourished, and in no distress.  HENT:  Head: Normocephalic and atraumatic.  Right Ear: Hearing, tympanic membrane, external ear and ear canal normal.  Left Ear: Hearing, tympanic membrane, external ear and ear canal normal.  Mouth/Throat: Oropharynx is clear and moist.  Eyes: EOM are normal. Pupils are equal, round, and reactive to light.  Neck: Neck supple. No thyromegaly present.  Cardiovascular: Normal rate, regular rhythm, normal heart sounds and intact distal pulses. Exam reveals no gallop and no friction rub.  No murmur heard. Pulmonary/Chest: Effort normal and breath sounds normal. She has no wheezes. She has no rales.  Abdominal: Soft. Bowel sounds are normal. She exhibits no distension and no mass. There is no tenderness.  Musculoskeletal: Normal range of motion. She exhibits no edema.  Lymphadenopathy:    She has no cervical adenopathy.  Neurological: She is alert and oriented to person, place, and time. She has normal reflexes. Gait normal.  Skin: Skin is warm and dry.  Psychiatric: Mood and affect normal.  Nursing note and vitals reviewed.   Recent Results (from the past 2160 hour(s))  TSH     Status: None   Collection Time: 12/30/16 10:46 AM  Result Value Ref Range   TSH 1.220 0.450 - 4.500 uIU/mL  Antiphospholipid Syndrome Comp     Status: None   Collection Time: 12/30/16 10:46 AM  Result Value Ref Range   APTT 27.8 sec    Comment: This test has not been validated for monitoring unfractionated heparin therapy.  aPTT-based therapeutic ranges for unfractionated heparin therapy have not been established.  Consider ordering Heparin anti-Xa (unfractionated). Reference Range: 18 years and older: 22.9  - 30.2    APTT 1:1 NP CANCELED sec    Comment: Testing Not Indicated Not indicated  Result canceled by the ancillary    APTT 1:1 Saline CANCELED sec    Comment: Testing Not Indicated Not indicated  Result canceled by the ancillary    DRVVT Screen Seconds 35.2 sec    Comment: Reference Range: <= 47.0    DRVVT Confirm Seconds CANCELED sec    Comment: Testing Not Indicated Not indicated  Result canceled by the ancillary    DRVVT Ratio CANCELED ratio    Comment: Testing Not Indicated Not indicated  Result canceled by the ancillary    Hexagonal Phospholipid Neutral 6 sec    Comment: This value is NEGATIVE. This is a qualitative assay and is therefore reported as positive for lupus anticoagulant or negative.  The quantitative value is provided as an aid in diagnosis. Reference Range: 0 - 11    Platelet Neutralization 0.0 sec    Comment: Reference Range: 0.0 - 3.0 This test was developed and its performance characteristics determined by [facility]. It has not been cleared or approved by the Food and Drug Administration.    Anticardiolipin Ab, IgG <10 GPL    Comment: Reference Range: Negative: <15 Indeterminate: 15 - 20 Low to medium positive: >20 - 80 High positive: >80    Anticardiolipin Ab, IgM <10 MPL    Comment: Reference Range: Negative: <13 Indeterminate: 13 - 20 Low to medium positive: >20 - 80 High positive: >80    Anticardiolipin Ab, IgA <10 APL    Comment: Reference Range: Negative: <12 Indeterminate: 12 - 20 Low to medium positive: >20 - 80 High positive: >80    Beta-2 Glycoprotein I, IgG <10 SGU    Comment: The reference interval reflects a 3SD or 99th percentile interval, which is thought to represent a potentially clinically significant result in accordance with the International Consensus Statement on the classification criteria for definitive antiphospholipid syndrome (APS). J Thromb Haem2006;4:295-306. Reference Range: Negative: <21      Beta-2 Glycoprotein I, IgM <10 SMU    Comment: The reference interval reflects a 3SD or 99th percentile interval, which is thought to represent a potentially clinically significant result in accordance with the International Consensus Statement on the classification criteria for definitive antiphospholipid syndrome (APS). J Thromb Haem2006;4:295-306. Reference Range: Negative: <33    Beta-2 Glycoprotein I, IgA <10 SAU    Comment: The reference interval reflects a 3SD or 99th percentile interval. Reference Range: Negative: <26    Antiphosphatidylserine IgG 0 GPS    Comment: Reference Range: <16 Low Positive: 16 - 30 Moderate Positive: 31 - 50 High Positive: >50    Antiphosphatidylserine IgM 4 MPS    Comment: Reference Range: <22 Low Positive: 22 - 35 Moderate Positive: 36 - 50 High Positive: >50  Antiprothrombin Antibody, IgG 8 G units    Comment: Reference Range: <21    LAC Interpretation Comment     Comment: A lupus anticoagulant is not detected. All antiphospholipid antibodies evaluated are normal. As antibody titers may fluctuate with time, repeat testing may be indicated. Please contact [facility] if further clarification is needed.     [place] Pelvic Complete With Transvaginal  Result Date: 01/15/2017 CLINICAL DATA:  Initial evaluation for left lower quadrant pain for 2 weeks. EXAM: TRANSABDOMINAL AND TRANSVAGINAL ULTRASOUND OF PELVIS TECHNIQUE: Both transabdominal and transvaginal ultrasound examinations of the pelvis were performed. Transabdominal technique was performed for global imaging of the pelvis including uterus, ovaries, adnexal regions, and pelvic cul-de-sac. It was necessary to proceed with endovaginal exam following the transabdominal exam to visualize the uterus and ovaries. COMPARISON:  Prior ultrasound from 01/19/2016. FINDINGS: Uterus Measurements: 7.2 x 2.4 x 3.1 cm. No fibroids or other mass visualized. Endometrium Thickness: 5.1 mm.   No focal abnormality visualized. Right ovary Measurements: 3.1 x 2.7 x 2.7 cm. Prominent peripherally located follicles, which can be seen in the setting of PCOS. Otherwise normal appearance. No adnexal mass. Left ovary Measurements: 3.3 x 1.9 x 2.9 cm. Prominent peripherally located follicles, which can be seen in the setting of PCOS. Otherwise normal appearance. No adnexal mass. Other findings No abnormal free fluid. IMPRESSION: 1. No acute abnormality within the pelvis. 2. Mildly prominent peripherally located follicles within the ovaries bilaterally, nonspecific, but can be seen in the setting of PCOS. 3. Otherwise unremarkable pelvic ultrasound. Electronically Signed   By: Rise MuBenjamin  McClintock M.D.   On: 01/15/2017 17:10     ASSESSMENT and PLAN  1. Secondary oligomenorrhea Patient might meet criteria for PCOS per [place]: menstrual dysfunction with abnormal us. Checking further labs and referring to REI for further evaluation and management.  - Testosterone; Future - Prolactin; Future - Follicle Stimulating Hormone; Future - Ambulatory referral to Endocrinology  2. History of multiple miscarriages - Ambulatory referral to Endocrinology  Return for after seeing specialist.    Myles LippsIrma M Santiago, MD [facility] at [facility] [address] [place], [place] [address] Ph.  [phone] Fax [phone]

## 2017-02-04 ENCOUNTER — Telehealth: Payer: Self-pay | Admitting: Family Medicine

## 2017-02-04 NOTE — Telephone Encounter (Signed)
Copied from CRM [phone]. Topic: Quick Communication - See Telephone Encounter >> Feb 04, 2017  8:54 AM Arlyss Gandyichardson, Verleen Stuckey N, NT wrote: CRM for notification. See Telephone encounter for: Patient calling to get lab results  02/04/17.

## 2017-02-04 NOTE — Telephone Encounter (Signed)
Lab Results

## 2017-02-04 NOTE — Telephone Encounter (Signed)
Copied from CRM [id number]. Topic: Quick Communication - See Telephone Encounter >> Feb 04, 2017  4:22 PM Rudi CocoLathan, Casie Sturgeon M, [place] wrote: Pt. Called again about lab results please call pt. When ready Samantha DarlingSimone Frances [phone]

## 2017-02-05 ENCOUNTER — Telehealth: Payer: Self-pay

## 2017-02-05 NOTE — Telephone Encounter (Signed)
I've never seen this patient

## 2017-02-05 NOTE — Telephone Encounter (Signed)
Please advise.   Copied from CRM [phone]. Topic: Quick Communication - See Telephone Encounter >> Feb 04, 2017  8:54 AM Arlyss Gandyichardson, Taren N, NT wrote: CRM for notification. See Telephone encounter for: Patient calling to get lab results  02/04/17. >> Feb 04, 2017  4:22 PM Rudi CocoLathan, Latoya M, [place] wrote: Pt. Called again about lab results please call pt. When ready Emeline DarlingSimone Blasius [phone]

## 2017-02-05 NOTE — Telephone Encounter (Signed)
Lab results have not been released

## 2017-02-06 NOTE — Telephone Encounter (Signed)
Pattricia Bossnnie, checking to see if this referral has been approved, scheduled. Thanks

## 2017-02-06 NOTE — Telephone Encounter (Signed)
Spoke with pt.  She was pleased everything was normal but confused as she states her body doesn't feel normal. States no menstrual period x 3 months and she states when it comes it is "brutal".  Has had 3 miscarriages "right in a row".  Would like to talk to Dr. Leretha PolSantiago to see where we go from here.

## 2017-02-06 NOTE — Telephone Encounter (Signed)
Please let  her know that her labs were normal. Thanks

## 2017-02-06 NOTE — Telephone Encounter (Signed)
Called pt w/results Sent lab letter with results.

## 2017-02-06 NOTE — Telephone Encounter (Signed)
That is why I am referring her to reproductive endocrinology as discussed in our last visit. Thanks

## 2017-02-06 NOTE — Telephone Encounter (Signed)
Please advise 

## 2017-02-09 NOTE — Telephone Encounter (Signed)
[facility]. Advised pt of this and gave her phone number. Thanks!

## 2017-06-26 ENCOUNTER — Ambulatory Visit: Payer: 59 | Admitting: Family Medicine

## 2017-07-06 ENCOUNTER — Telehealth: Payer: Self-pay | Admitting: Family Medicine

## 2017-07-06 NOTE — Telephone Encounter (Signed)
Called pt to try and reschedule her missed appt from 06/26/17. Was able to reschedule for 07/07/17 with Dr. Leretha PolSantiago - advised of building, time and late policy.

## 2017-07-07 ENCOUNTER — Encounter: Payer: Self-pay | Admitting: Family Medicine

## 2017-07-07 ENCOUNTER — Ambulatory Visit (INDEPENDENT_AMBULATORY_CARE_PROVIDER_SITE_OTHER): Payer: 59 | Admitting: Family Medicine

## 2017-07-07 ENCOUNTER — Other Ambulatory Visit: Payer: Self-pay

## 2017-07-07 VITALS — BP 108/72 | HR 116 | Temp 98.8°F | Resp 16 | Ht 64.0 in | Wt 171.0 lb

## 2017-07-07 DIAGNOSIS — F411 Generalized anxiety disorder: Secondary | ICD-10-CM | POA: Diagnosis not present

## 2017-07-07 DIAGNOSIS — J302 Other seasonal allergic rhinitis: Secondary | ICD-10-CM | POA: Diagnosis not present

## 2017-07-07 DIAGNOSIS — F410 Panic disorder [episodic paroxysmal anxiety] without agoraphobia: Secondary | ICD-10-CM

## 2017-07-07 DIAGNOSIS — E282 Polycystic ovarian syndrome: Secondary | ICD-10-CM

## 2017-07-07 DIAGNOSIS — N912 Amenorrhea, unspecified: Secondary | ICD-10-CM

## 2017-07-07 LAB — POCT URINE PREGNANCY: Preg Test, Ur: NEGATIVE

## 2017-07-07 MED ORDER — METFORMIN HCL ER 500 MG PO TB24: 500.0000 mg | ORAL_TABLET | Freq: Every day | ORAL | 3 refills | Status: DC

## 2017-07-07 MED ORDER — CETIRIZINE HCL 10 MG PO TABS: 10.0000 mg | ORAL_TABLET | Freq: Every day | ORAL | 11 refills | Status: DC

## 2017-07-07 MED ORDER — FLUOXETINE HCL 60 MG PO TABS: 60.0000 mg | ORAL_TABLET | Freq: Every day | ORAL | 3 refills | Status: DC

## 2017-07-07 MED ORDER — FLUTICASONE PROPIONATE 50 MCG/ACT NA SUSP
1.0000 | Freq: Two times a day (BID) | NASAL | 6 refills | Status: DC
Start: 2017-07-07 — End: 2021-02-06

## 2017-07-07 NOTE — Patient Instructions (Signed)
     IF you received an x-ray today, you will receive an invoice from [facility]. Please contact  Radiology at [phone] with questions or concerns regarding your invoice.   IF you received labwork today, you will receive an invoice from [facility]. Please contact [facility] at [phone] with questions or concerns regarding your invoice.   Our billing staff will not be able to assist you with questions regarding bills from these companies.  You will be contacted with the lab results as soon as they are available. The fastest way to get your results is to activate your My Chart account. Instructions are located on the last page of this paperwork. If you have not heard from us regarding the results in 2 weeks, please contact this office.     

## 2017-07-07 NOTE — Progress Notes (Signed)
4/16/201911:50 AM  Samantha Jimenez Dec 15, 1990, 27 y.o. female [id number]  Chief Complaint  Patient presents with  . Nasal Congestion  . Cough    x 3 weeks     HPI:   Patient is a 27 y.o. female with past medical history significant for anemia and anxiety who presents today with several concerns:  1. Nasal congestion, clear rhinorrhea, dry cough, worse at night. No fever, chills, sinus pain, ear pain, SOB. She has intermittent sneezing and itchiness, symptoms present for 3 weeks. Has not tried OTC meds.   2. Anxiety not well controlled. Starting to have panic attacks again.   3. She saw REI, diagnosed with PCOS, was able to conceive with assistance of medications, then had a SAB in Jan, has not had her period since then. Used to be on metformin, would like to restart. Currently not sexually active. Denies any polydipsia or polyuria, nausea, vomiting, abd pain. Not interested in conceiving at this time.  Depression screen [facility] 2/9 07/07/2017 01/30/2017 12/30/2016  Decreased Interest 0 0 3  Down, Depressed, Hopeless 0 0 0  PHQ - 2 Score 0 0 3  Altered sleeping - - 0  Tired, decreased energy - - 3  Change in appetite - - 3  Feeling bad or failure about yourself  - - 0  Trouble concentrating - - 0  Moving slowly or fidgety/restless - - 0  Suicidal thoughts - - 0  PHQ-9 Score - - 9    Allergies  Allergen Reactions  . Oxycodone-Acetaminophen Itching and Nausea And Vomiting    Prior to Admission medications   Medication Sig Start Date End Date Taking? Authorizing Provider  FLUoxetine (PROZAC) 40 MG capsule Take 1 capsule (40 mg total) by mouth daily. 12/30/16  Yes Myles Lipps, MD  hydrOXYzine (ATARAX/VISTARIL) 10 MG tablet 1-2 tablets at bedtime as needed 12/30/16  Yes Myles Lipps, MD  LORazepam (ATIVAN) 0.5 MG tablet Take 1 tablet (0.5 mg total) by mouth 2 (two) times daily as needed (for severe panic attacks). 12/15/16  Yes Myles Lipps, MD    Past Medical History:   Diagnosis Date  . Anemia   . Anxiety   . History of multiple miscarriages   . PCOS (polycystic ovarian syndrome)   . Recurrent UTI     History reviewed. No pertinent surgical history.  Social History   Tobacco Use  . Smoking status: Current Every Day Smoker    Types: Cigarettes    Last attempt to quit: 05/11/2016    Years since quitting: 1.1  . Smokeless tobacco: Never Used  . Tobacco comment: 1 pack per week  Substance Use Topics  . Alcohol use: Yes    Alcohol/week: 2.4 oz    Types: 4 Glasses of wine per week    Family History  Problem Relation Age of Onset  . Hypertension Mother   . Hypertension Maternal Grandmother     ROS Per hpi  OBJECTIVE:  Blood pressure 108/72, pulse (!) 116, temperature 98.8 F (37.1 C), temperature source Oral, resp. rate 16, height 5\' 4"  (1.626 m), weight 171 lb (77.6 kg), SpO2 96 %.  Physical Exam  Constitutional: She is oriented to person, place, and time. She appears well-developed and well-nourished.  HENT:  Head: Normocephalic and atraumatic.  Right Ear: Hearing, tympanic membrane, external ear and ear canal normal.  Left Ear: Hearing, tympanic membrane, external ear and ear canal normal.  Mouth/Throat: Oropharynx is clear and moist.  Eyes: Pupils are equal, round,  and reactive to light. EOM are normal.  Neck: Neck supple.  Cardiovascular: Normal rate, regular rhythm and normal heart sounds. Exam reveals no gallop and no friction rub.  No murmur heard. Pulmonary/Chest: Effort normal and breath sounds normal. She has no wheezes. She has no rales.  Lymphadenopathy:    She has no cervical adenopathy.  Neurological: She is alert and oriented to person, place, and time.  Skin: Skin is warm and dry.  Psychiatric: She has a normal mood and affect.      Results for orders placed or performed in visit on 07/07/17 (from the past 24 hour(s))  POCT urine pregnancy     Status: None   Collection Time: 07/07/17 12:11 PM  Result Value  Ref Range   Preg Test, Ur Negative Negative     ASSESSMENT and PLAN  1. Generalized anxiety disorder with panic attacks Not well controlled. Increasing fluoxetine to max dose. If not improved consider changing SSRI vs SNRI.  - FLUoxetine HCl 60 MG TABS; Take 60 mg by mouth daily.  2. Amet norrhea Secondary to PCOS, restarting metformin, patient reports regular menses while on it. - POCT urine pregnancy  3. PCOS (polycystic ovarian syndrome) - metFORMIN (GLUCOPHAGE-XR) 500 MG 24 hr tablet; Take 1 tablet (500 mg total) by mouth daily with breakfast. - Hemoglobin A1c  4. Seasonal allergies - fluticasone (FLONASE) 50 MCG/ACT nasal spray; Place 1 spray into both nostrils 2 (two) times daily. - cetirizine (ZYRTEC) 10 MG tablet; Take 1 tablet (10 mg total) by mouth daily.  Return in about 1 month (around 08/04/2017).    Myles LippsIrma M Santiago, MD Primary Care at [facility] [address] [place], [place] [address] Ph.  [phone] Fax [phone]

## 2017-07-08 LAB — HEMOGLOBIN A1C
Est. average glucose Bld gHb Est-mCnc: 103 mg/dL
Hgb A1c MFr Bld: 5.2 % (ref 4.8–5.6)

## 2017-08-04 ENCOUNTER — Ambulatory Visit: Payer: 59 | Admitting: Family Medicine

## 2017-12-14 DIAGNOSIS — N760 Acute vaginitis: Secondary | ICD-10-CM | POA: Diagnosis not present

## 2017-12-14 DIAGNOSIS — Z113 Encounter for screening for infections with a predominantly sexual mode of transmission: Secondary | ICD-10-CM | POA: Diagnosis not present

## 2017-12-14 DIAGNOSIS — Z7251 High risk heterosexual behavior: Secondary | ICD-10-CM | POA: Diagnosis not present

## 2018-01-08 ENCOUNTER — Other Ambulatory Visit: Payer: Self-pay | Admitting: Family Medicine

## 2018-01-08 DIAGNOSIS — N760 Acute vaginitis: Secondary | ICD-10-CM | POA: Diagnosis not present

## 2018-01-08 DIAGNOSIS — Z7251 High risk heterosexual behavior: Secondary | ICD-10-CM | POA: Diagnosis not present

## 2018-01-08 DIAGNOSIS — Z113 Encounter for screening for infections with a predominantly sexual mode of transmission: Secondary | ICD-10-CM | POA: Diagnosis not present

## 2018-01-08 NOTE — Telephone Encounter (Signed)
30 day courtesy refill given until appt on 01/29/18.

## 2018-01-11 ENCOUNTER — Ambulatory Visit: Payer: Self-pay

## 2018-01-11 ENCOUNTER — Encounter (HOSPITAL_COMMUNITY): Payer: Self-pay | Admitting: Emergency Medicine

## 2018-01-11 ENCOUNTER — Emergency Department (HOSPITAL_COMMUNITY)
Admission: EM | Admit: 2018-01-11 | Discharge: 2018-01-11 | Disposition: A | Discharge status: Home / Self Care | Payer: BLUE CROSS/BLUE SHIELD | Attending: Emergency Medicine | Admitting: Emergency Medicine

## 2018-01-11 DIAGNOSIS — N939 Abnormal uterine and vaginal bleeding, unspecified: Secondary | ICD-10-CM | POA: Insufficient documentation

## 2018-01-11 DIAGNOSIS — Z5321 Procedure and treatment not carried out due to patient leaving prior to being seen by health care provider: Secondary | ICD-10-CM | POA: Diagnosis not present

## 2018-01-11 DIAGNOSIS — R1030 Lower abdominal pain, unspecified: Secondary | ICD-10-CM | POA: Diagnosis not present

## 2018-01-11 LAB — CBC
HCT: 40.9 % (ref 36.0–46.0)
Hemoglobin: 13.2 g/dL (ref 12.0–15.0)
MCH: 29.3 pg (ref 26.0–34.0)
MCHC: 32.3 g/dL (ref 30.0–36.0)
MCV: 90.7 fL (ref 80.0–100.0)
PLATELETS: 261 10*3/uL (ref 150–400)
RBC: 4.51 MIL/uL (ref 3.87–5.11)
RDW: 13.1 % (ref 11.5–15.5)
WBC: 5 10*3/uL (ref 4.0–10.5)
nRBC: 0 % (ref 0.0–0.2)

## 2018-01-11 LAB — COMPREHENSIVE METABOLIC PANEL
ALT: 19 U/L (ref 0–44)
AST: 27 U/L (ref 15–41)
Albumin: 3.8 g/dL (ref 3.5–5.0)
Alkaline Phosphatase: 71 U/L (ref 38–126)
Anion gap: 9 (ref 5–15)
BILIRUBIN TOTAL: 0.4 mg/dL (ref 0.3–1.2)
BUN: 6 mg/dL (ref 6–20)
CALCIUM: 9 mg/dL (ref 8.9–10.3)
CO2: 23 mmol/L (ref 22–32)
CREATININE: 0.72 mg/dL (ref 0.44–1.00)
Chloride: 107 mmol/L (ref 98–111)
GFR calc Af Amer: >60 mL/min (ref >60)
Glucose, Bld: 84 mg/dL (ref 70–99)
POTASSIUM: 3.9 mmol/L (ref 3.5–5.1)
Sodium: 139 mmol/L (ref 135–145)
TOTAL PROTEIN: 6.8 g/dL (ref 6.5–8.1)

## 2018-01-11 LAB — I-STAT BETA HCG BLOOD, ED (MC, WL, AP ONLY)

## 2018-01-11 NOTE — Telephone Encounter (Signed)
FYI ?Please see note below. ?

## 2018-01-11 NOTE — ED Triage Notes (Signed)
Pt to ER for evaluation of irregular vaginal bleeding and lower abdominal pain x1 week with nausea and vomiting, diarrhea. Reports PCOS hx and is noncompliant with medications.

## 2018-01-11 NOTE — Telephone Encounter (Signed)
Pt c/o sharp pelvic area and lower back pian. Pt also c/o bright red to pink vaginal bleeding. Pt stated the pelvic pain doesn't feel like menstrual cramping. Pt stated that pain began last week and has gotten worse. Pain started gradually. Pt stated pain is severe. Pt stated that the pain is dull when bending over.  Pt stated over the weekend she had nausea and vomiting and diarrhea.  Pt advised to go to the ED for evaluation. Care advice given and pt verbalized understanding.   Reason for Disposition . [1] SEVERE pain (e.g., excruciating) AND [2] present > 1 hour  Answer Assessment - Initial Assessment Questions 1. LOCATION: "Where does it hurt?"      Pelvic area and lower back 2. RADIATION: "Does the pain shoot anywhere else?" (e.g., chest, back)     back 3. ONSET: "When did the pain begin?" (e.g., minutes, hours or days ago)      Last week the pain began but has gotten worse 4. SUDDEN: "Gradual or sudden onset?"     gradually 5. PATTERN "Does the pain come and go, or is it constant?"    - If constant: "Is it getting better, staying the same, or worsening?"      (Note: Constant means the pain never goes away completely; most serious pain is constant and it progresses)     - If intermittent: "How long does it last?" "Do you have pain now?"     (Note: Intermittent means the pain goes away completely between bouts)     constant 6. SEVERITY: "How bad is the pain?"  (e.g., Scale 1-10; mild, moderate, or severe)   - MILD (1-3): doesn't interfere with normal activities, abdomen soft and not tender to touch    - MODERATE (4-7): interferes with normal activities or awakens from sleep, tender to touch    - SEVERE (8-10): excruciating pain, doubled over, unable to do any normal activities      severe 7. RECURRENT SYMPTOM: "Have you ever had this type of abdominal pain before?" If so, ask: "When was the last time?" and "What happened that time?"      no 8. CAUSE: "What do you think is causing the  abdominal pain?"     Pt does not know 9. RELIEVING/AGGRAVATING FACTORS: "What makes it better or worse?" (e.g., movement, antacids, bowel movement)     Bending over makes pain feel dull rather than sharp 10. OTHER SYMPTOMS: "Has there been any vomiting, diarrhea, constipation, or urine problems?"      Over the weekend nausea, vomiting and diarrhea, vaginal bleeding.  11. PREGNANCY: "Is there any chance you are pregnant?" "When was your last menstrual period?"       No LMP: June  Protocols used: ABDOMINAL PAIN [facility]

## 2018-01-11 NOTE — ED Notes (Signed)
Pt stated she's leaving. Pt exited ER waiting room at this time.

## 2018-01-13 DIAGNOSIS — R300 Dysuria: Secondary | ICD-10-CM | POA: Diagnosis not present

## 2018-01-13 DIAGNOSIS — Z0000 Encounter for general adult medical examination without abnormal findings: Secondary | ICD-10-CM | POA: Diagnosis not present

## 2018-01-13 DIAGNOSIS — E559 Vitamin D deficiency, unspecified: Secondary | ICD-10-CM | POA: Diagnosis not present

## 2018-01-13 DIAGNOSIS — E282 Polycystic ovarian syndrome: Secondary | ICD-10-CM | POA: Diagnosis not present

## 2018-01-13 DIAGNOSIS — Z87891 Personal history of nicotine dependence: Secondary | ICD-10-CM | POA: Diagnosis not present

## 2018-01-13 DIAGNOSIS — R0602 Shortness of breath: Secondary | ICD-10-CM | POA: Diagnosis not present

## 2018-01-13 DIAGNOSIS — Z23 Encounter for immunization: Secondary | ICD-10-CM | POA: Diagnosis not present

## 2018-01-13 DIAGNOSIS — N946 Dysmenorrhea, unspecified: Secondary | ICD-10-CM | POA: Diagnosis not present

## 2018-01-13 DIAGNOSIS — R5383 Other fatigue: Secondary | ICD-10-CM | POA: Diagnosis not present

## 2018-01-28 DIAGNOSIS — R0602 Shortness of breath: Secondary | ICD-10-CM | POA: Diagnosis not present

## 2018-01-28 DIAGNOSIS — R942 Abnormal results of pulmonary function studies: Secondary | ICD-10-CM | POA: Diagnosis not present

## 2018-01-28 DIAGNOSIS — Z6832 Body mass index (BMI) 32.0-32.9, adult: Secondary | ICD-10-CM | POA: Diagnosis not present

## 2018-01-29 ENCOUNTER — Ambulatory Visit: Payer: Self-pay | Admitting: Family Medicine

## 2018-02-02 DIAGNOSIS — D649 Anemia, unspecified: Secondary | ICD-10-CM | POA: Insufficient documentation

## 2018-02-02 DIAGNOSIS — E282 Polycystic ovarian syndrome: Secondary | ICD-10-CM | POA: Insufficient documentation

## 2018-02-02 DIAGNOSIS — N390 Urinary tract infection, site not specified: Secondary | ICD-10-CM | POA: Insufficient documentation

## 2018-02-02 DIAGNOSIS — F419 Anxiety disorder, unspecified: Secondary | ICD-10-CM | POA: Insufficient documentation

## 2018-02-02 DIAGNOSIS — N96 Recurrent pregnancy loss: Secondary | ICD-10-CM | POA: Insufficient documentation

## 2018-02-03 DIAGNOSIS — E282 Polycystic ovarian syndrome: Secondary | ICD-10-CM | POA: Diagnosis not present

## 2018-02-03 DIAGNOSIS — Z01419 Encounter for gynecological examination (general) (routine) without abnormal findings: Secondary | ICD-10-CM | POA: Diagnosis not present

## 2018-02-03 DIAGNOSIS — Z30011 Encounter for initial prescription of contraceptive pills: Secondary | ICD-10-CM | POA: Diagnosis not present

## 2018-02-03 DIAGNOSIS — Z124 Encounter for screening for malignant neoplasm of cervix: Secondary | ICD-10-CM | POA: Diagnosis not present

## 2018-02-08 DIAGNOSIS — Z87891 Personal history of nicotine dependence: Secondary | ICD-10-CM | POA: Diagnosis not present

## 2018-02-08 DIAGNOSIS — Z202 Contact with and (suspected) exposure to infections with a predominantly sexual mode of transmission: Secondary | ICD-10-CM | POA: Diagnosis not present

## 2018-02-08 DIAGNOSIS — N390 Urinary tract infection, site not specified: Secondary | ICD-10-CM | POA: Diagnosis not present

## 2018-02-08 DIAGNOSIS — R300 Dysuria: Secondary | ICD-10-CM | POA: Diagnosis not present

## 2018-02-08 DIAGNOSIS — E559 Vitamin D deficiency, unspecified: Secondary | ICD-10-CM | POA: Diagnosis not present

## 2018-03-08 ENCOUNTER — Encounter: Payer: Self-pay | Admitting: *Deleted

## 2018-03-12 ENCOUNTER — Encounter: Payer: Self-pay | Admitting: Family Medicine

## 2018-03-16 DIAGNOSIS — N760 Acute vaginitis: Secondary | ICD-10-CM | POA: Diagnosis not present

## 2018-03-16 DIAGNOSIS — N942 Vaginismus: Secondary | ICD-10-CM | POA: Diagnosis not present

## 2018-03-29 IMAGING — US US PELVIS COMPLETE TRANSABD/TRANSVAG
1 series · 13 of 25 positions shown · non-contrast
Comparison: Prior ultrasound from 01/19/2016.

CLINICAL DATA: Initial evaluation for left lower quadrant pain for
2 weeks.

EXAM:
TRANSABDOMINAL AND TRANSVAGINAL ULTRASOUND OF PELVIS
TECHNIQUE: Both transabdominal and transvaginal ultrasound examinations of the
pelvis were performed. Transabdominal technique was performed for
global imaging of the pelvis including uterus, ovaries, adnexal
regions, and pelvic cul-de-sac. It was necessary to proceed with
endovaginal exam following the transabdominal exam to visualize the
uterus and ovaries.

[Series 1: us pelvis complete transabd/transvag · 0.23mm/px · 13 of 62 slices shown]
[im 1/62]
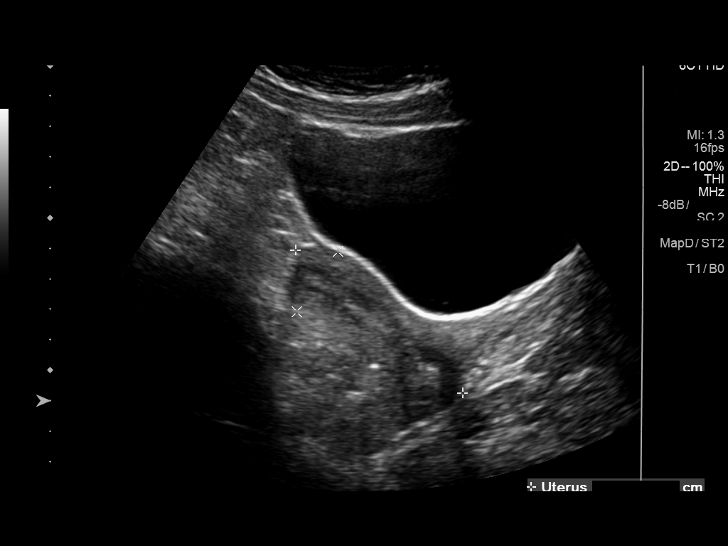
[im 6/62]
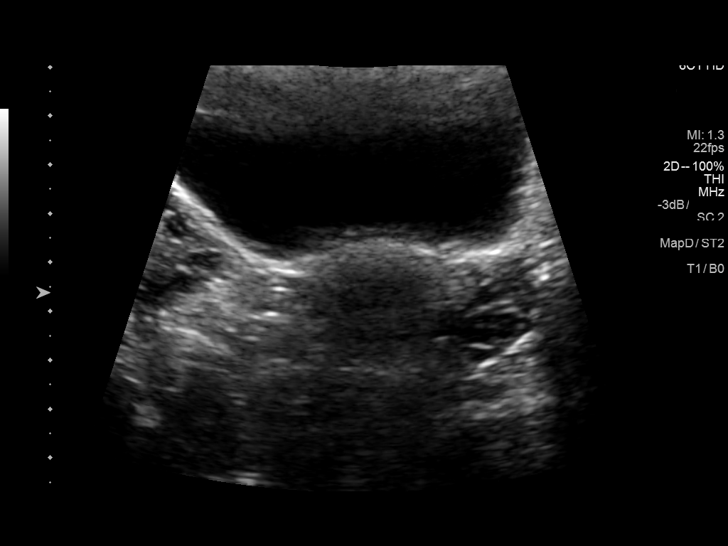
[im 11/62]
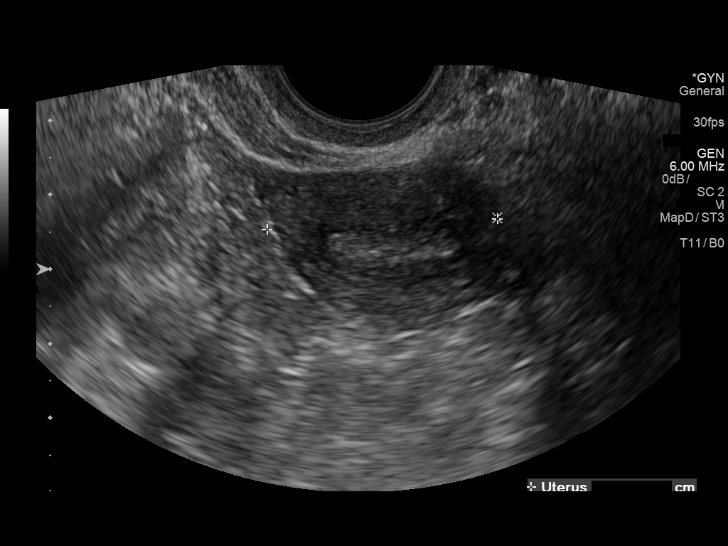
[im 16/62]
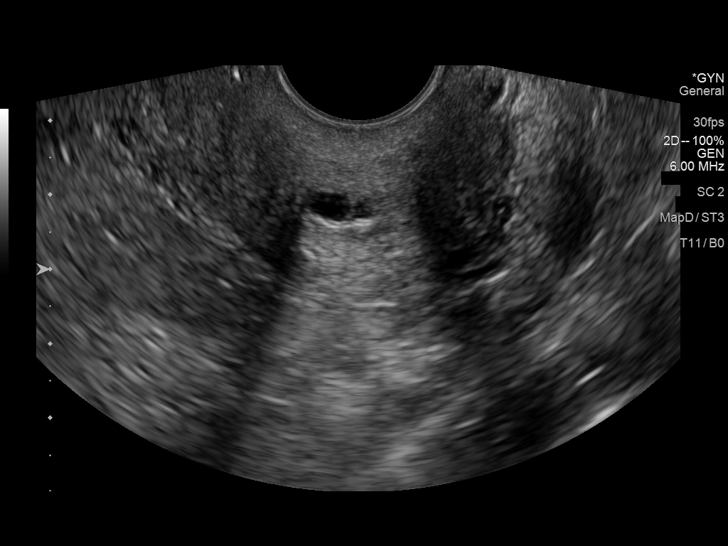
[im 21/62]
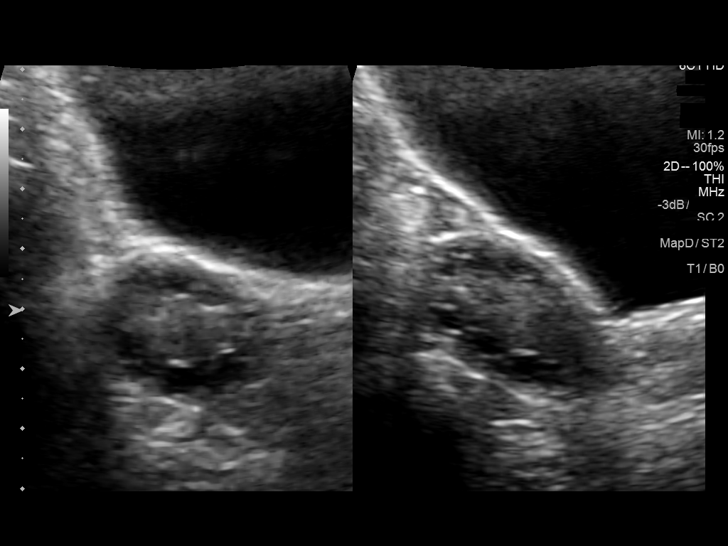
[im 26/62]
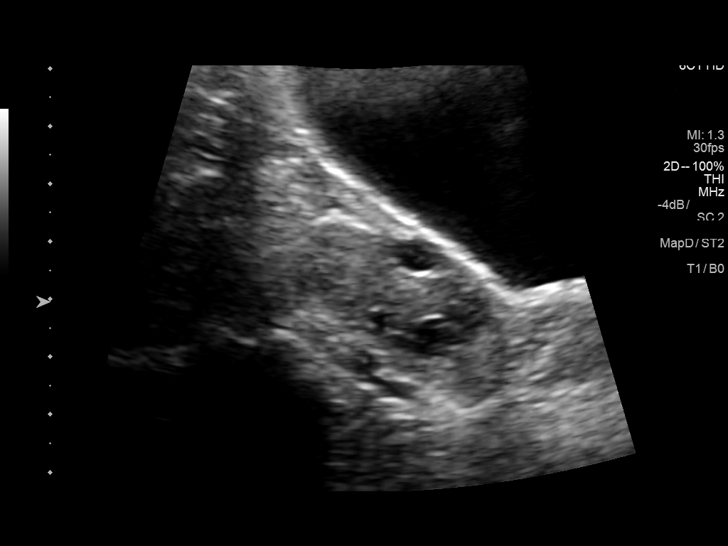
[im 31/62]
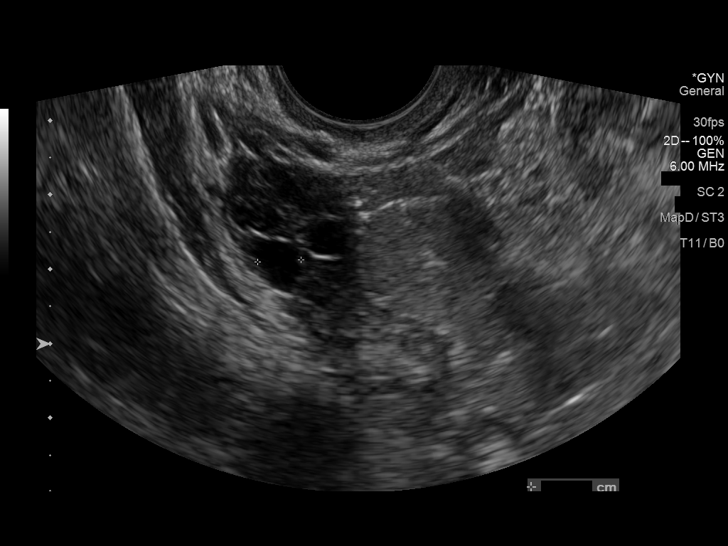
[im 36/62]
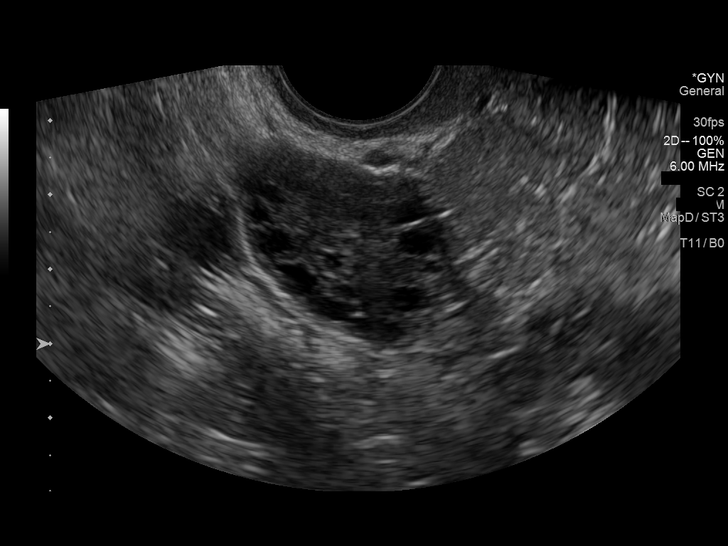
[im 41/62]
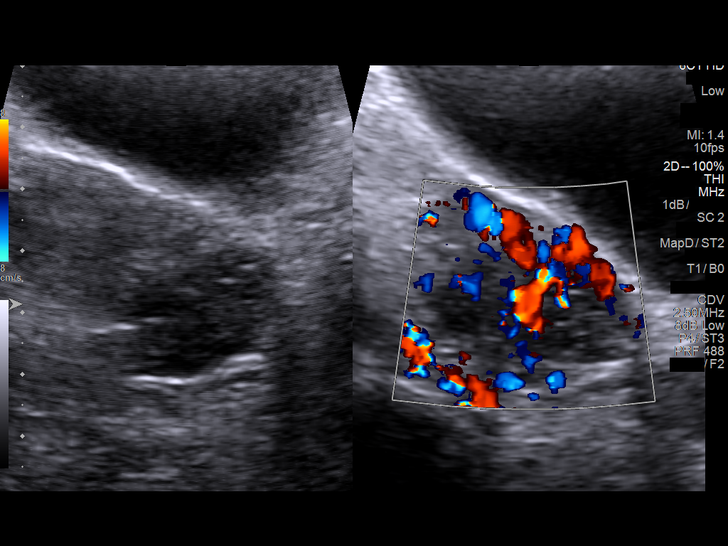
[im 46/62]
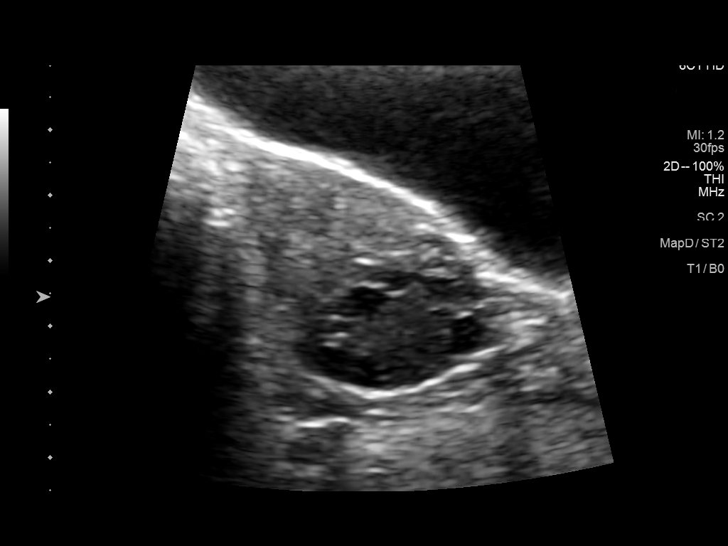
[im 51/62]
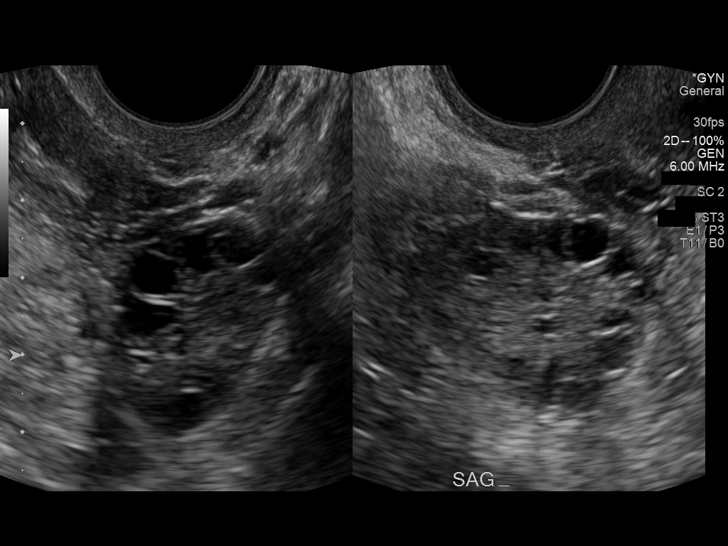
[im 56/62]
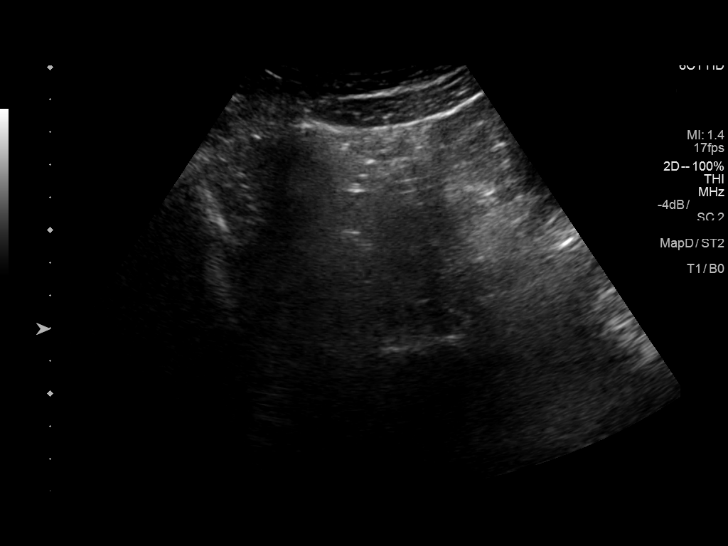
[im 62/62]
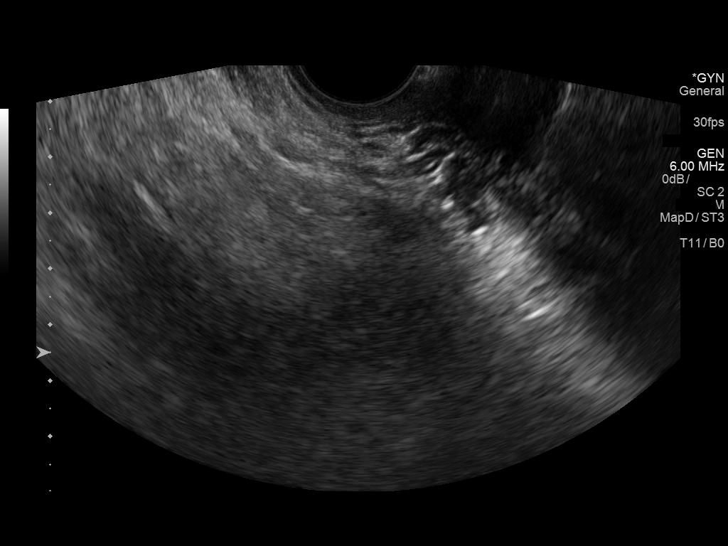

[13 of 25 positions shown; findings below may reference images not displayed]

FINDINGS: Uterus

Measurements: 7.2 x 2.4 x 3.1 cm. No fibroids or other mass
visualized.

Endometrium

Thickness: 5.1 mm.  No focal abnormality visualized.

Right ovary

Measurements: 3.1 x 2.7 x 2.7 cm. Prominent peripherally located
follicles, which can be seen in the setting of PCOS. Otherwise
normal appearance. No adnexal mass.

Left ovary

Measurements: 3.3 x 1.9 x 2.9 cm. Prominent peripherally located
follicles, which can be seen in the setting of PCOS. Otherwise
normal appearance. No adnexal mass.

Other findings

No abnormal free fluid.
IMPRESSION: 1. No acute abnormality within the pelvis.
2. Mildly prominent peripherally located follicles within the
ovaries bilaterally, nonspecific, but can be seen in the setting of
PCOS.
3. Otherwise unremarkable pelvic ultrasound.

## 2018-04-11 DIAGNOSIS — Z202 Contact with and (suspected) exposure to infections with a predominantly sexual mode of transmission: Secondary | ICD-10-CM | POA: Diagnosis not present

## 2018-07-16 DIAGNOSIS — N949 Unspecified condition associated with female genital organs and menstrual cycle: Secondary | ICD-10-CM | POA: Diagnosis not present

## 2018-07-16 DIAGNOSIS — Z7689 Persons encountering health services in other specified circumstances: Secondary | ICD-10-CM | POA: Diagnosis not present

## 2018-07-16 DIAGNOSIS — N898 Other specified noninflammatory disorders of vagina: Secondary | ICD-10-CM | POA: Diagnosis not present

## 2018-07-22 DIAGNOSIS — A549 Gonococcal infection, unspecified: Secondary | ICD-10-CM | POA: Diagnosis not present

## 2018-10-21 DIAGNOSIS — R5383 Other fatigue: Secondary | ICD-10-CM | POA: Diagnosis not present

## 2018-10-21 DIAGNOSIS — R0789 Other chest pain: Secondary | ICD-10-CM | POA: Diagnosis not present

## 2018-10-21 DIAGNOSIS — R002 Palpitations: Secondary | ICD-10-CM | POA: Diagnosis not present

## 2018-10-22 DIAGNOSIS — R002 Palpitations: Secondary | ICD-10-CM | POA: Diagnosis not present

## 2018-10-22 DIAGNOSIS — R0789 Other chest pain: Secondary | ICD-10-CM | POA: Diagnosis not present

## 2018-10-22 DIAGNOSIS — R5383 Other fatigue: Secondary | ICD-10-CM | POA: Diagnosis not present

## 2018-11-30 DIAGNOSIS — N760 Acute vaginitis: Secondary | ICD-10-CM | POA: Diagnosis not present

## 2018-11-30 DIAGNOSIS — N83209 Unspecified ovarian cyst, unspecified side: Secondary | ICD-10-CM | POA: Diagnosis not present

## 2018-12-15 ENCOUNTER — Other Ambulatory Visit: Payer: Self-pay

## 2018-12-15 ENCOUNTER — Encounter: Payer: Self-pay | Admitting: Family Medicine

## 2018-12-15 ENCOUNTER — Ambulatory Visit (INDEPENDENT_AMBULATORY_CARE_PROVIDER_SITE_OTHER): Payer: BC Managed Care – PPO | Admitting: Family Medicine

## 2018-12-15 ENCOUNTER — Other Ambulatory Visit: Payer: Self-pay | Admitting: Family Medicine

## 2018-12-15 ENCOUNTER — Other Ambulatory Visit (HOSPITAL_COMMUNITY)
Admission: RE | Admit: 2018-12-15 | Discharge: 2018-12-15 | Disposition: A | Discharge status: Home / Self Care | Payer: BC Managed Care – PPO | Source: Ambulatory Visit | Attending: Family Medicine | Admitting: Family Medicine

## 2018-12-15 VITALS — BP 127/86 | HR 75 | Temp 98.7°F | Ht 64.0 in | Wt 189.4 lb

## 2018-12-15 DIAGNOSIS — E282 Polycystic ovarian syndrome: Secondary | ICD-10-CM

## 2018-12-15 DIAGNOSIS — F411 Generalized anxiety disorder: Secondary | ICD-10-CM | POA: Diagnosis not present

## 2018-12-15 DIAGNOSIS — N3091 Cystitis, unspecified with hematuria: Secondary | ICD-10-CM

## 2018-12-15 DIAGNOSIS — R300 Dysuria: Secondary | ICD-10-CM | POA: Diagnosis not present

## 2018-12-15 DIAGNOSIS — F5104 Psychophysiologic insomnia: Secondary | ICD-10-CM | POA: Diagnosis not present

## 2018-12-15 DIAGNOSIS — N912 Amenorrhea, unspecified: Secondary | ICD-10-CM | POA: Diagnosis not present

## 2018-12-15 DIAGNOSIS — Z113 Encounter for screening for infections with a predominantly sexual mode of transmission: Secondary | ICD-10-CM | POA: Diagnosis not present

## 2018-12-15 DIAGNOSIS — Z5181 Encounter for therapeutic drug level monitoring: Secondary | ICD-10-CM

## 2018-12-15 LAB — POCT URINALYSIS DIP (MANUAL ENTRY)
Bilirubin, UA: NEGATIVE
Glucose, UA: NEGATIVE mg/dL
Ketones, POC UA: NEGATIVE mg/dL
Nitrite, UA: NEGATIVE
Protein Ur, POC: NEGATIVE mg/dL
Spec Grav, UA: 1.02 (ref 1.010–1.025)
Urobilinogen, UA: 0.2 E.U./dL
pH, UA: 8.5 — AB (ref 5.0–8.0)

## 2018-12-15 LAB — POC MICROSCOPIC URINALYSIS (UMFC): Mucus: ABSENT

## 2018-12-15 LAB — POCT URINE PREGNANCY: Preg Test, Ur: NEGATIVE

## 2018-12-15 MED ORDER — HYDROXYZINE HCL 10 MG PO TABS: ORAL_TABLET | ORAL | 0 refills | Status: DC

## 2018-12-15 MED ORDER — METFORMIN HCL ER 500 MG PO TB24: 500.0000 mg | ORAL_TABLET | Freq: Every day | ORAL | 3 refills | Status: DC

## 2018-12-15 MED ORDER — NITROFURANTOIN MONOHYD MACRO 100 MG PO CAPS: 100.0000 mg | ORAL_CAPSULE | Freq: Two times a day (BID) | ORAL | 0 refills | Status: DC

## 2018-12-15 MED ORDER — PHENAZOPYRIDINE HCL 100 MG PO TABS: 100.0000 mg | ORAL_TABLET | Freq: Three times a day (TID) | ORAL | 0 refills | Status: DC | PRN

## 2018-12-15 MED ORDER — FLUOXETINE HCL 60 MG PO TABS: 60.0000 mg | ORAL_TABLET | Freq: Every day | ORAL | 3 refills | Status: DC

## 2018-12-15 NOTE — Progress Notes (Signed)
Subjective:    Patient ID: Samantha Jimenez, female    DOB: 16-Jan-1991, 28 y.o.   MRN: [id number]  HPI Rheda Kassab is a 28 y.o. female Presents today for: Chief Complaint  Patient presents with   Abdominal Pain   Dysuria    Started yesterday    PHQ9= 19    Has not been taking medication for this    New patient to me with multiple concerns above.   Dysuria: Started yesterday. Burning sensation, associated with urgency and frequency.  No fever/n/v.  Some low back pain noted past few days. Last treated UTI end of last year.   Sexually active, current partner 3 months, unprotected intercourse, no recent STI testing. No new vaginal discharge. Treated with diflucan for yeast infection few weeks ago.   On chart review she was treated in April urgent care for gonorrhea.  Has been treated with Rocephin 250 mg as well as Zithromax.  Per notes did have intercourse 2 days later with the same partner.  Reported some pelvic pain with intercourse at that time. Repeat treatment with Rocephin 250 mg IM, 1000 mg azithromycin.  Depression/Anxiety.  Not currently on medication. Generalized anxiety disorder with panic attacks discussed in April 2019 with Dr. Pamella Pert.  Symptoms were not well controlled at times fluoxetine was increased to 60 mg daily with plan on follow-up in 1 month.  Has not had follow-up with Dr. Pamella Pert since that time. Ran out of fluoxetine few months ago. 60mg  dose was working well. No current therapist.  Anxiety has worsened off meds.  No SI/HI.  Coping techniques: breathing techniques, self isolation.   Depression screen [facility] 2/9 12/15/2018 07/07/2017 01/30/2017 12/30/2016 12/15/2016  Decreased Interest 3 0 0 3 1  Down, Depressed, Hopeless 3 0 0 0 1  PHQ - 2 Score 6 0 0 3 2  Altered sleeping 3 - - 0 3  Tired, decreased energy 3 - - 3 3  Change in appetite 3 - - 3 3  Feeling bad or failure about yourself  1 - - 0 0  Trouble concentrating 0 - - 0 3  Moving slowly or  fidgety/restless 3 - - 0 3  Suicidal thoughts 0 - - 0 0  PHQ-9 Score 19 - - 9 17  Difficult doing work/chores Very difficult - - - -    PCOS: Prior on metformin 500mg  QD, ran out months ago.  Last menses few moths ago - unknown.    Patient Active Problem List   Diagnosis Date Noted   Generalized anxiety disorder with panic attacks 12/15/2016   Past Medical History:  Diagnosis Date   Anemia    Anxiety    History of multiple miscarriages    PCOS (polycystic ovarian syndrome)    Recurrent UTI    No past surgical history on file. Allergies  Allergen Reactions   Oxycodone-Acetaminophen Itching and Nausea And Vomiting   Prior to Admission medications   Medication Sig Start Date End Date Taking? Authorizing Provider  cetirizine (ZYRTEC) 10 MG tablet Take 1 tablet (10 mg total) by mouth daily. Patient not taking: Reported on 12/15/2018 07/07/17   Rutherford Guys, MD  FLUoxetine (PROZAC) 40 MG capsule TAKE ONE CAPSULE BY MOUTH DAILY Patient not taking: Reported on 12/15/2018 01/08/18   Rutherford Guys, MD  FLUoxetine HCl 60 MG TABS Take 60 mg by mouth daily. Patient not taking: Reported on 12/15/2018 07/07/17   Rutherford Guys, MD  fluticasone [facility]) 50 MCG/ACT nasal spray Place  1 spray into both nostrils 2 (two) times daily. Patient not taking: Reported on 12/15/2018 07/07/17   Myles Lipps, MD  hydrOXYzine (ATARAX/VISTARIL) 10 MG tablet 1-2 tablets at bedtime as needed Patient not taking: Reported on 12/15/2018 12/30/16   Myles Lipps, MD  LORazepam (ATIVAN) 0.5 MG tablet Take 1 tablet (0.5 mg total) by mouth 2 (two) times daily as needed (for severe panic attacks). Patient not taking: Reported on 12/15/2018 12/15/16   Myles Lipps, MD  metFORMIN (GLUCOPHAGE-XR) 500 MG 24 hr tablet Take 1 tablet (500 mg total) by mouth daily with breakfast. Patient not taking: Reported on 12/15/2018 07/07/17   Myles Lipps, MD   Social History   Socioeconomic History    Marital status: Married    Spouse name: Not on file   Number of children: Not on file   Years of education: Not on file   Highest education level: Not on file  Occupational History   Not on file  Social Needs   Financial resource strain: Not on file   Food insecurity    Worry: Not on file    Inability: Not on file   Transportation needs    Medical: Not on file    Non-medical: Not on file  Tobacco Use   Smoking status: Current Every Day Smoker    Types: Cigarettes    Last attempt to quit: 05/11/2016    Years since quitting: 2.5   Smokeless tobacco: Never Used   Tobacco comment: 1 pack per week  Substance and Sexual Activity   Alcohol use: Yes    Alcohol/week: 4.0 standard drinks    Types: 4 Glasses of wine per week   Drug use: No   Sexual activity: Yes    Birth control/protection: None  Lifestyle   Physical activity    Days per week: Not on file    Minutes per session: Not on file   Stress: Not on file  Relationships   Social connections    Talks on phone: Not on file    Gets together: Not on file    Attends religious service: Not on file    Active member of club or organization: Not on file    Attends meetings of clubs or organizations: Not on file    Relationship status: Not on file   Intimate partner violence    Fear of current or ex partner: Not on file    Emotionally abused: Not on file    Physically abused: Not on file    Forced sexual activity: Not on file  Other Topics Concern   Not on file  Social History Narrative   Not on file    Review of Systems Per HPI.     Objective:   Physical Exam Vitals signs reviewed.  Constitutional:      General: She is not in acute distress.    Appearance: She is well-developed.  HENT:     Head: Normocephalic and atraumatic.  Cardiovascular:     Rate and Rhythm: Normal rate.  Pulmonary:     Effort: Pulmonary effort is normal.  Abdominal:     Tenderness: There is abdominal tenderness in the  suprapubic area. There is no right CVA tenderness, left CVA tenderness, guarding or rebound.  Neurological:     Mental Status: She is alert and oriented to person, place, and time.  Psychiatric:        Attention and Perception: Attention normal.        Mood  and Affect: Mood and affect normal.        Speech: Speech normal.        Behavior: Behavior normal.        Thought Content: Thought content normal. Thought content does not include homicidal or suicidal ideation.    Vitals:   12/15/18 1144  BP: 127/86  Pulse: 75  Temp: 98.7 F (37.1 C)  TempSrc: Oral  SpO2: 98%  Weight: 189 lb 6.4 oz (85.9 kg)  Height: 5\' 4"  (1.626 m)   Results for orders placed or performed in visit on 12/15/18  POCT urinalysis dipstick  Result Value Ref Range   Color, UA yellow yellow   Clarity, UA cloudy (A) clear   Glucose, UA negative negative mg/dL   Bilirubin, UA negative negative   Ketones, POC UA negative negative mg/dL   Spec Grav, UA 0.768 0.881 - 1.025   Blood, UA moderate (A) negative   pH, UA 8.5 (A) 5.0 - 8.0   Protein Ur, POC negative negative mg/dL   Urobilinogen, UA 0.2 0.2 or 1.0 E.U./dL   Nitrite, UA Negative Negative   Leukocytes, UA Moderate (2+) (A) Negative  POCT Microscopic Urinalysis ([facility])  Result Value Ref Range   WBC,UR,HPF,POC Too numerous to count  (A) None WBC/hpf   RBC,UR,HPF,POC Too numerous to count  (A) None RBC/hpf   Bacteria Many (A) None, Too numerous to count   Mucus Absent Absent   Epithelial Cells, UR Per Microscopy Many (A) None, Too numerous to count cells/hpf  POCT urine pregnancy  Result Value Ref Range   Preg Test, Ur Negative Negative        Assessment & Plan:    Sumiko Lanzer is a 28 y.o. female Dysuria - Plan: POCT urinalysis dipstick, POCT Microscopic Urinalysis ([facility]), Urine Culture, GC/Chlamydia probe amp ([facility])not at [facility], nitrofurantoin, macrocrystal-monohydrate, (MACROBID) 100 MG capsule, phenazopyridine (PYRIDIUM) 100 MG  tablet Hemorrhagic cystitis - Plan: nitrofurantoin, macrocrystal-monohydrate, (MACROBID) 100 MG capsule, phenazopyridine (PYRIDIUM) 100 MG tablet  -Macrobid 100 mg twice daily, Pyridium as needed for symptoms, check urine culture, fluids, symptomatic care discussed with RTC precautions  Generalized anxiety disorder - Plan: FLUoxetine HCl 60 MG TABS Psychophysiological insomnia - Plan: hydrOXYzine (ATARAX/VISTARIL) 10 MG tablet  -Unfortunately worsening symptoms off medication.  Restart fluoxetine at previous doses that was effective, hydroxyzine given for sleep, follow-up with primary care provider next few weeks.  Amenorrhea - Plan: POCT urine pregnancy PCOS (polycystic ovarian syndrome) - Plan: metFORMIN (GLUCOPHAGE-XR) 500 MG 24 hr tablet Medication monitoring encounter - Plan: Comprehensive metabolic panel  -Restart metformin, follow-up with primary care provider to discuss further  Routine screening for STI (sexually transmitted infection) - Plan: GC/Chlamydia probe amp ()not at [facility], [facility], HIV Antibody (routine testing w rflx)    Meds ordered this encounter  Medications   FLUoxetine HCl 60 MG TABS    Sig: Take 60 mg by mouth daily.    Dispense:  30 tablet    Refill:  3    May change to capsules if covered.   hydrOXYzine (ATARAX/VISTARIL) 10 MG tablet    Sig: 1-2 tablets at bedtime as needed    Dispense:  60 tablet    Refill:  0   metFORMIN (GLUCOPHAGE-XR) 500 MG 24 hr tablet    Sig: Take 1 tablet (500 mg total) by mouth daily with breakfast.    Dispense:  30 tablet    Refill:  3   nitrofurantoin, macrocrystal-monohydrate, (MACROBID) 100 MG capsule    Sig:  Take 1 capsule (100 mg total) by mouth 2 (two) times daily.    Dispense:  14 capsule    Refill:  0   phenazopyridine (PYRIDIUM) 100 MG tablet    Sig: Take 1 tablet (100 mg total) by mouth 3 (three) times daily as needed for pain.    Dispense:  10 tablet    Refill:  0   Patient Instructions    Restart  fluoxetine, hydroxyzine if needed, follow up in next few weeks with Dr. Leretha Pol.  Calling your previous therapist may also help.   I refilled metformin, but follow up with Dr. Leretha Pol to discuss PCOS.   Start antibiotic for urinary tract infection, make sure to drink plenty of fluids.  Pyridium if needed temporarily.  I will check a urine culture and STI testing as we discussed.  Return to the clinic or go to the nearest emergency room if any of your symptoms worsen or new symptoms occur.  Urinary Tract Infection, Adult  A urinary tract infection (UTI) is an infection of any part of the urinary tract. The urinary tract includes the kidneys, ureters, bladder, and urethra. These organs make, store, and get rid of urine in the body. Your health care provider may use other names to describe the infection. An upper UTI affects the ureters and kidneys (pyelonephritis). A lower UTI affects the bladder (cystitis) and urethra (urethritis). What are the causes? Most urinary tract infections are caused by bacteria in your genital area, around the entrance to your urinary tract (urethra). These bacteria grow and cause inflammation of your urinary tract. What increases the risk? You are more likely to develop this condition if:  You have a urinary catheter that stays in place (indwelling).  You are not able to control when you urinate or have a bowel movement (you have incontinence).  You are female and you: ? Use a spermicide or diaphragm for birth control. ? Have low estrogen levels. ? Are pregnant.  You have certain genes that increase your risk (genetics).  You are sexually active.  You take antibiotic medicines.  You have a condition that causes your flow of urine to slow down, such as: ? An enlarged prostate, if you are female. ? Blockage in your urethra (stricture). ? A kidney stone. ? A nerve condition that affects your bladder control (neurogenic bladder). ? Not getting enough to  drink, or not urinating often.  You have certain medical conditions, such as: ? Diabetes. ? A weak disease-fighting system (immunesystem). ? Sickle cell disease. ? Gout. ? Spinal cord injury. What are the signs or symptoms? Symptoms of this condition include:  Needing to urinate right away (urgently).  Frequent urination or passing small amounts of urine frequently.  Pain or burning with urination.  Blood in the urine.  Urine that smells bad or unusual.  Trouble urinating.  Cloudy urine.  Vaginal discharge, if you are female.  Pain in the abdomen or the lower back. You may also have:  Vomiting or a decreased appetite.  Confusion.  Irritability or tiredness.  A fever.  Diarrhea. The first symptom in older adults may be confusion. In some cases, they may not have any symptoms until the infection has worsened. How is this diagnosed? This condition is diagnosed based on your medical history and a physical exam. You may also have other tests, including:  Urine tests.  Blood tests.  Tests for sexually transmitted infections (STIs). If you have had more than one UTI, a cystoscopy or imaging  studies may be done to determine the cause of the infections. How is this treated? Treatment for this condition includes:  Antibiotic medicine.  Over-the-counter medicines to treat discomfort.  Drinking enough water to stay hydrated. If you have frequent infections or have other conditions such as a kidney stone, you may need to see a health care provider who specializes in the urinary tract (urologist). In rare cases, urinary tract infections can cause sepsis. Sepsis is a life-threatening condition that occurs when the body responds to an infection. Sepsis is treated in the hospital with IV antibiotics, fluids, and other medicines. Follow these instructions at home:  Medicines  Take over-the-counter and prescription medicines only as told by your health care provider.  If  you were prescribed an antibiotic medicine, take it as told by your health care provider. Do not stop using the antibiotic even if you start to feel better. General instructions  Make sure you: ? Empty your bladder often and completely. Do not hold urine for long periods of time. ? Empty your bladder after sex. ? Wipe from front to back after a bowel movement if you are female. Use each tissue one time when you wipe.  Drink enough fluid to keep your urine pale yellow.  Keep all follow-up visits as told by your health care provider. This is important. Contact a health care provider if:  Your symptoms do not get better after 1-2 days.  Your symptoms go away and then return. Get help right away if you have:  Severe pain in your back or your lower abdomen.  A fever.  Nausea or vomiting. Summary  A urinary tract infection (UTI) is an infection of any part of the urinary tract, which includes the kidneys, ureters, bladder, and urethra.  Most urinary tract infections are caused by bacteria in your genital area, around the entrance to your urinary tract (urethra).  Treatment for this condition often includes antibiotic medicines.  If you were prescribed an antibiotic medicine, take it as told by your health care provider. Do not stop using the antibiotic even if you start to feel better.  Keep all follow-up visits as told by your health care provider. This is important. This information is not intended to replace advice given to you by your health care provider. Make sure you discuss any questions you have with your health care provider. Document Released: 12/18/2004 Document Revised: 02/25/2018 Document Reviewed: 09/17/2017 Elsevier Patient Education  2020 Elsevier Inc.    Living With Anxiety  After being diagnosed with an anxiety disorder, you may be relieved to know why you have felt or behaved a certain way. It is natural to also feel overwhelmed about the treatment ahead and  what it will mean for your life. With care and support, you can manage this condition and recover from it. How to cope with anxiety Dealing with stress Stress is your bodys reaction to life changes and events, both good and bad. Stress can last just a few hours or it can be ongoing. Stress can play a major role in anxiety, so it is important to learn both how to cope with stress and how to think about it differently. Talk with your health care provider or a counselor to learn more about stress reduction. He or she may suggest some stress reduction techniques, such as:  Music therapy. This can include creating or listening to music that you enjoy and that inspires you.  Mindfulness-based meditation. This involves being aware of your normal breaths,  rather than trying to control your breathing. It can be done while sitting or walking.  Centering prayer. This is a kind of meditation that involves focusing on a word, phrase, or sacred image that is meaningful to you and that brings you peace.  Deep breathing. To do this, expand your stomach and inhale slowly through your nose. Hold your breath for 3-5 seconds. Then exhale slowly, allowing your stomach muscles to relax.  Self-talk. This is a skill where you identify thought patterns that lead to anxiety reactions and correct those thoughts.  Muscle relaxation. This involves tensing muscles then relaxing them. Choose a stress reduction technique that fits your lifestyle and personality. Stress reduction techniques take time and practice. Set aside 5-15 minutes a day to do them. Therapists can offer training in these techniques. The training may be covered by some insurance plans. Other things you can do to manage stress include:  Keeping a stress diary. This can help you learn what triggers your stress and ways to control your response.  Thinking about how you respond to certain situations. You may not be able to control everything, but you can  control your reaction.  Making time for activities that help you relax, and not feeling guilty about spending your time in this way. Therapy combined with coping and stress-reduction skills provides the best chance for successful treatment. Medicines Medicines can help ease symptoms. Medicines for anxiety include:  Anti-anxiety drugs.  Antidepressants.  Beta-blockers. Medicines may be used as the main treatment for anxiety disorder, along with therapy, or if other treatments are not working. Medicines should be prescribed by a health care provider. Relationships Relationships can play a big part in helping you recover. Try to spend more time connecting with trusted friends and family members. Consider going to couples counseling, taking family education classes, or going to family therapy. Therapy can help you and others better understand the condition. How to recognize changes in your condition Everyone has a different response to treatment for anxiety. Recovery from anxiety happens when symptoms decrease and stop interfering with your daily activities at home or work. This may mean that you will start to:  Have better concentration and focus.  Sleep better.  Be less irritable.  Have more energy.  Have improved memory. It is important to recognize when your condition is getting worse. Contact your health care provider if your symptoms interfere with home or work and you do not feel like your condition is improving. Where to find help and support: You can get help and support from these sources:  Self-help groups.  Online and [employer].  A trusted spiritual leader.  Couples counseling.  Family education classes.  Family therapy. Follow these instructions at home:  Eat a healthy diet that includes plenty of vegetables, fruits, whole grains, low-fat dairy products, and lean protein. Do not eat a lot of foods that are high in solid fats, added sugars, or  salt.  Exercise. Most adults should do the following: ? Exercise for at least 150 minutes each week. The exercise should increase your heart rate and make you sweat (moderate-intensity exercise). ? Strengthening exercises at least twice a week.  Cut down on caffeine, tobacco, alcohol, and other potentially harmful substances.  Get the right amount and quality of sleep. Most adults need 7-9 hours of sleep each night.  Make choices that simplify your life.  Take over-the-counter and prescription medicines only as told by your health care provider.  Avoid caffeine, alcohol, and certain  over-the-counter cold medicines. These may make you feel worse. Ask your pharmacist which medicines to avoid.  Keep all follow-up visits as told by your health care provider. This is important. Questions to ask your health care provider  Would I benefit from therapy?  How often should I follow up with a health care provider?  How long do I need to take medicine?  Are there any long-term side effects of my medicine?  Are there any alternatives to taking medicine? Contact a health care provider if:  You have a hard time staying focused or finishing daily tasks.  You spend many hours a day feeling worried about everyday life.  You become exhausted by worry.  You start to have headaches, feel tense, or have nausea.  You urinate more than normal.  You have diarrhea. Get help right away if:  You have a racing heart and shortness of breath.  You have thoughts of hurting yourself or others. If you ever feel like you may hurt yourself or others, or have thoughts about taking your own life, get help right away. You can go to your nearest emergency department or call:  Your local emergency services (911 in the [place]  A suicide crisis helpline, such as the National Suicide Prevention Lifeline at 4703897797. This is open 24-hours a day. Summary  Taking steps to deal with stress can help calm  you.  Medicines cannot cure anxiety disorders, but they can help ease symptoms.  Family, friends, and partners can play a big part in helping you recover from an anxiety disorder. This information is not intended to replace advice given to you by your health care provider. Make sure you discuss any questions you have with your health care provider. Document Released: 03/04/2016 Document Revised: 02/20/2017 Document Reviewed: 03/04/2016 Elsevier Patient Education  [employer].     If you have lab work done today you will be contacted with your lab results within the next 2 weeks.  If you have not heard from [place] then please contact us. The fastest way to get your results is to register for My Chart.   IF you received an x-ray today, you will receive an invoice from [facility]. Please contact [facility] at [phone] with questions or concerns regarding your invoice.   IF you received labwork today, you will receive an invoice from [place]. Please contact [facility] at [phone] with questions or concerns regarding your invoice.   Our billing staff will not be able to assist you with questions regarding bills from these companies.  You will be contacted with the lab results as soon as they are available. The fastest way to get your results is to activate your My Chart account. Instructions are located on the last page of this paperwork. If you have not heard from [place] regarding the results in 2 weeks, please contact this office.       Signed,   Meredith Staggers, MD Primary Care at [facility].  12/15/18 10:05 PM

## 2018-12-15 NOTE — Patient Instructions (Addendum)
Restart fluoxetine, hydroxyzine if needed, follow up in next few weeks with Dr. Leretha PolSantiago.  Calling your previous therapist may also help.   I refilled metformin, but follow up with Dr. Leretha PolSantiago to discuss PCOS.   Start antibiotic for urinary tract infection, make sure to drink plenty of fluids.  Pyridium if needed temporarily.  I will check a urine culture and STI testing as we discussed.  Return to the clinic or go to the nearest emergency room if any of your symptoms worsen or new symptoms occur.  Urinary Tract Infection, Adult  A urinary tract infection (UTI) is an infection of any part of the urinary tract. The urinary tract includes the kidneys, ureters, bladder, and urethra. These organs make, store, and get rid of urine in the body. Your health care provider may use other names to describe the infection. An upper UTI affects the ureters and kidneys (pyelonephritis). A lower UTI affects the bladder (cystitis) and urethra (urethritis). What are the causes? Most urinary tract infections are caused by bacteria in your genital area, around the entrance to your urinary tract (urethra). These bacteria grow and cause inflammation of your urinary tract. What increases the risk? You are more likely to develop this condition if:  You have a urinary catheter that stays in place (indwelling).  You are not able to control when you urinate or have a bowel movement (you have incontinence).  You are female and you: ? Use a spermicide or diaphragm for birth control. ? Have low estrogen levels. ? Are pregnant.  You have certain genes that increase your risk (genetics).  You are sexually active.  You take antibiotic medicines.  You have a condition that causes your flow of urine to slow down, such as: ? An enlarged prostate, if you are female. ? Blockage in your urethra (stricture). ? A kidney stone. ? A nerve condition that affects your bladder control (neurogenic bladder). ? Not getting  enough to drink, or not urinating often.  You have certain medical conditions, such as: ? Diabetes. ? A weak disease-fighting system (immunesystem). ? Sickle cell disease. ? Gout. ? Spinal cord injury. What are the signs or symptoms? Symptoms of this condition include:  Needing to urinate right away (urgently).  Frequent urination or passing small amounts of urine frequently.  Pain or burning with urination.  Blood in the urine.  Urine that smells bad or unusual.  Trouble urinating.  Cloudy urine.  Vaginal discharge, if you are female.  Pain in the abdomen or the lower back. You may also have:  Vomiting or a decreased appetite.  Confusion.  Irritability or tiredness.  A fever.  Diarrhea. The first symptom in older adults may be confusion. In some cases, they may not have any symptoms until the infection has worsened. How is this diagnosed? This condition is diagnosed based on your medical history and a physical exam. You may also have other tests, including:  Urine tests.  Blood tests.  Tests for sexually transmitted infections (STIs). If you have had more than one UTI, a cystoscopy or imaging studies may be done to determine the cause of the infections. How is this treated? Treatment for this condition includes:  Antibiotic medicine.  Over-the-counter medicines to treat discomfort.  Drinking enough water to stay hydrated. If you have frequent infections or have other conditions such as a kidney stone, you may need to see a health care provider who specializes in the urinary tract (urologist). In rare cases, urinary tract infections can  cause sepsis. Sepsis is a life-threatening condition that occurs when the body responds to an infection. Sepsis is treated in the hospital with IV antibiotics, fluids, and other medicines. Follow these instructions at home:  Medicines  Take over-the-counter and prescription medicines only as told by your health care  provider.  If you were prescribed an antibiotic medicine, take it as told by your health care provider. Do not stop using the antibiotic even if you start to feel better. General instructions  Make sure you: ? Empty your bladder often and completely. Do not hold urine for long periods of time. ? Empty your bladder after sex. ? Wipe from front to back after a bowel movement if you are female. Use each tissue one time when you wipe.  Drink enough fluid to keep your urine pale yellow.  Keep all follow-up visits as told by your health care provider. This is important. Contact a health care provider if:  Your symptoms do not get better after 1-2 days.  Your symptoms go away and then return. Get help right away if you have:  Severe pain in your back or your lower abdomen.  A fever.  Nausea or vomiting. Summary  A urinary tract infection (UTI) is an infection of any part of the urinary tract, which includes the kidneys, ureters, bladder, and urethra.  Most urinary tract infections are caused by bacteria in your genital area, around the entrance to your urinary tract (urethra).  Treatment for this condition often includes antibiotic medicines.  If you were prescribed an antibiotic medicine, take it as told by your health care provider. Do not stop using the antibiotic even if you start to feel better.  Keep all follow-up visits as told by your health care provider. This is important. This information is not intended to replace advice given to you by your health care provider. Make sure you discuss any questions you have with your health care provider. Document Released: 12/18/2004 Document Revised: 02/25/2018 Document Reviewed: 09/17/2017 Elsevier Patient Education  2020 Elsevier Inc[employer]    Living With Anxiety  After being diagnosed with an anxiety disorder, you may be relieved to know why you have felt or behaved a certain way. It is natural to also feel overwhelmed about the  treatment ahead and what it will mean for your life. With care and support, you can manage this condition and recover from it. How to cope with anxiety Dealing with stress Stress is your body's reaction to life changes and events, both good and bad. Stress can last just a few hours or it can be ongoing. Stress can play a major role in anxiety, so it is important to learn both how to cope with stress and how to think about it differently. Talk with your health care provider or a counselor to learn more about stress reduction. He or she may suggest some stress reduction techniques, such as:  Music therapy. This can include creating or listening to music that you enjoy and that inspires you.  Mindfulness-based meditation. This involves being aware of your normal breaths, rather than trying to control your breathing. It can be done while sitting or walking.  Centering prayer. This is a kind of meditation that involves focusing on a word, phrase, or sacred image that is meaningful to you and that brings you peace.  Deep breathing. To do this, expand your stomach and inhale slowly through your nose. Hold your breath for 3-5 seconds. Then exhale slowly, allowing your stomach muscles to  relax.  Self-talk. This is a skill where you identify thought patterns that lead to anxiety reactions and correct those thoughts.  Muscle relaxation. This involves tensing muscles then relaxing them. Choose a stress reduction technique that fits your lifestyle and personality. Stress reduction techniques take time and practice. Set aside 5-15 minutes a day to do them. Therapists can offer training in these techniques. The training may be covered by some insurance plans. Other things you can do to manage stress include:  Keeping a stress diary. This can help you learn what triggers your stress and ways to control your response.  Thinking about how you respond to certain situations. You may not be able to control  everything, but you can control your reaction.  Making time for activities that help you relax, and not feeling guilty about spending your time in this way. Therapy combined with coping and stress-reduction skills provides the best chance for successful treatment. Medicines Medicines can help ease symptoms. Medicines for anxiety include:  Anti-anxiety drugs.  Antidepressants.  Beta-blockers. Medicines may be used as the main treatment for anxiety disorder, along with therapy, or if other treatments are not working. Medicines should be prescribed by a health care provider. Relationships Relationships can play a big part in helping you recover. Try to spend more time connecting with trusted friends and family members. Consider going to couples counseling, taking family education classes, or going to family therapy. Therapy can help you and others better understand the condition. How to recognize changes in your condition Everyone has a different response to treatment for anxiety. Recovery from anxiety happens when symptoms decrease and stop interfering with your daily activities at home or work. This may mean that you will start to:  Have better concentration and focus.  Sleep better.  Be less irritable.  Have more energy.  Have improved memory. It is important to recognize when your condition is getting worse. Contact your health care provider if your symptoms interfere with home or work and you do not feel like your condition is improving. Where to find help and support: You can get help and support from these sources:  Self-help groups.  Online and [employer].  A trusted spiritual leader.  Couples counseling.  Family education classes.  Family therapy. Follow these instructions at home:  Eat a healthy diet that includes plenty of vegetables, fruits, whole grains, low-fat dairy products, and lean protein. Do not eat a lot of foods that are high in solid fats,  added sugars, or salt.  Exercise. Most adults should do the following: ? Exercise for at least 150 minutes each week. The exercise should increase your heart rate and make you sweat (moderate-intensity exercise). ? Strengthening exercises at least twice a week.  Cut down on caffeine, tobacco, alcohol, and other potentially harmful substances.  Get the right amount and quality of sleep. Most adults need 7-9 hours of sleep each night.  Make choices that simplify your life.  Take over-the-counter and prescription medicines only as told by your health care provider.  Avoid caffeine, alcohol, and certain over-the-counter cold medicines. These may make you feel worse. Ask your pharmacist which medicines to avoid.  Keep all follow-up visits as told by your health care provider. This is important. Questions to ask your health care provider  Would I benefit from therapy?  How often should I follow up with a health care provider?  How long do I need to take medicine?  Are there any long-term side effects of  my medicine?  Are there any alternatives to taking medicine? Contact a health care provider if:  You have a hard time staying focused or finishing daily tasks.  You spend many hours a day feeling worried about everyday life.  You become exhausted by worry.  You start to have headaches, feel tense, or have nausea.  You urinate more than normal.  You have diarrhea. Get help right away if:  You have a racing heart and shortness of breath.  You have thoughts of hurting yourself or others. If you ever feel like you may hurt yourself or others, or have thoughts about taking your own life, get help right away. You can go to your nearest emergency department or call:  Your local emergency services (911 in the [place]  A suicide crisis helpline, such as the National Suicide Prevention Lifeline at 740-550-9452. This is open 24-hours a day. Summary  Taking steps to deal with  stress can help calm you.  Medicines cannot cure anxiety disorders, but they can help ease symptoms.  Family, friends, and partners can play a big part in helping you recover from an anxiety disorder. This information is not intended to replace advice given to you by your health care provider. Make sure you discuss any questions you have with your health care provider. Document Released: 03/04/2016 Document Revised: 02/20/2017 Document Reviewed: 03/04/2016 Elsevier Patient Education  [employer].     If you have lab work done today you will be contacted with your lab results within the next 2 weeks.  If you have not heard from [place] then please contact us. The fastest way to get your results is to register for My Chart.   IF you received an x-ray today, you will receive an invoice from [facility]. Please contact [facility] at [phone] with questions or concerns regarding your invoice.   IF you received labwork today, you will receive an invoice from [facility]. Please contact [facility] at [phone] with questions or concerns regarding your invoice.   Our billing staff will not be able to assist you with questions regarding bills from these companies.  You will be contacted with the lab results as soon as they are available. The fastest way to get your results is to activate your My Chart account. Instructions are located on the last page of this paperwork. If you have not heard from [place] regarding the results in 2 weeks, please contact this office.

## 2018-12-16 LAB — COMPREHENSIVE METABOLIC PANEL
ALT: 16 IU/L (ref 0–32)
AST: 21 IU/L (ref 0–40)
Albumin/Globulin Ratio: 1.5 (ref 1.2–2.2)
Albumin: 4.4 g/dL (ref 3.9–5.0)
Alkaline Phosphatase: 97 IU/L (ref 39–117)
BUN/Creatinine Ratio: 8 — ABNORMAL LOW (ref 9–23)
BUN: 6 mg/dL (ref 6–20)
Bilirubin Total: 0.3 mg/dL (ref 0.0–1.2)
CO2: 23 mmol/L (ref 20–29)
Calcium: 9.7 mg/dL (ref 8.7–10.2)
Chloride: 102 mmol/L (ref 96–106)
Creatinine, Ser: 0.76 mg/dL (ref 0.57–1.00)
GFR calc Af Amer: 124 mL/min/{1.73_m2} (ref >59)
GFR calc non Af Amer: 108 mL/min/{1.73_m2} (ref >59)
Globulin, Total: 2.9 g/dL (ref 1.5–4.5)
Glucose: 84 mg/dL (ref 65–99)
Potassium: 4.4 mmol/L (ref 3.5–5.2)
Sodium: 138 mmol/L (ref 134–144)
Total Protein: 7.3 g/dL (ref 6.0–8.5)

## 2018-12-16 LAB — URINE CYTOLOGY ANCILLARY ONLY
Chlamydia: NEGATIVE
Neisseria Gonorrhea: NEGATIVE

## 2018-12-16 LAB — RPR: RPR Ser Ql: NONREACTIVE

## 2018-12-16 LAB — HIV ANTIBODY (ROUTINE TESTING W REFLEX): HIV Screen 4th Generation wRfx: NONREACTIVE

## 2018-12-17 LAB — URINE CULTURE

## 2019-01-06 ENCOUNTER — Ambulatory Visit (INDEPENDENT_AMBULATORY_CARE_PROVIDER_SITE_OTHER): Payer: BC Managed Care – PPO | Admitting: Family Medicine

## 2019-01-06 ENCOUNTER — Other Ambulatory Visit: Payer: Self-pay

## 2019-01-06 ENCOUNTER — Encounter: Payer: Self-pay | Admitting: Family Medicine

## 2019-01-06 VITALS — BP 120/86 | HR 89 | Temp 98.5°F | Ht 64.0 in | Wt 183.6 lb

## 2019-01-06 DIAGNOSIS — Z20828 Contact with and (suspected) exposure to other viral communicable diseases: Secondary | ICD-10-CM

## 2019-01-06 DIAGNOSIS — F5104 Psychophysiologic insomnia: Secondary | ICD-10-CM

## 2019-01-06 DIAGNOSIS — E282 Polycystic ovarian syndrome: Secondary | ICD-10-CM

## 2019-01-06 DIAGNOSIS — F419 Anxiety disorder, unspecified: Secondary | ICD-10-CM | POA: Diagnosis not present

## 2019-01-06 DIAGNOSIS — Z20822 Contact with and (suspected) exposure to covid-19: Secondary | ICD-10-CM

## 2019-01-06 DIAGNOSIS — F32A Depression, unspecified: Secondary | ICD-10-CM

## 2019-01-06 DIAGNOSIS — F329 Major depressive disorder, single episode, unspecified: Secondary | ICD-10-CM

## 2019-01-06 MED ORDER — HYDROXYZINE HCL 10 MG PO TABS: ORAL_TABLET | ORAL | 0 refills | Status: DC

## 2019-01-06 MED ORDER — FLUOXETINE HCL 20 MG PO CAPS: 60.0000 mg | ORAL_CAPSULE | Freq: Every day | ORAL | 3 refills | Status: DC

## 2019-01-06 MED ORDER — METFORMIN HCL ER 500 MG PO TB24: 500.0000 mg | ORAL_TABLET | Freq: Every day | ORAL | 3 refills | Status: DC

## 2019-01-06 NOTE — Addendum Note (Signed)
Addended by: Amalia Hailey on: 01/06/2019 11:41 AM   Modules accepted: Orders

## 2019-01-06 NOTE — Progress Notes (Signed)
10/15/202010:37 AM  Samantha Jimenez 1991-02-24, 28 y.o., female [id number]  Chief Complaint  Patient presents with  . Anxiety    review of the meds, also wants to screen for covid  . Depression    HPI:   Patient is a 28 y.o. female with past medical history significant for PCOS, anxiety and depression who presents today for med refill  Last OV sept 2020 with Dr Carlota Raspberry Treated for UTI Restarted fluoxetine, added vistaril Restarted metformin Was doing well on her meds but sopped months ago due to family pressures She is not doing well Having panic attacks at work Sign anhedonia Just started meds about 2 weeks ago Cant remember LMP, no interested in conceiving at this time  Wt Readings from Last 3 Encounters:  01/06/19 183 lb 9.6 oz (83.3 kg)  12/15/18 189 lb 6.4 oz (85.9 kg)  07/07/17 171 lb (77.6 kg)   GAD 7 : Generalized Anxiety Score 01/06/2019 12/30/2016 12/15/2016  Nervous, Anxious, on Edge 3 0 3  Control/stop worrying 3 0 3  Worry too much - different things 3 0 3  Trouble relaxing 3 0 3  Restless 3 0 3  Easily annoyed or irritable 3 0 3  Afraid - awful might happen - 0 3  Total GAD 7 Score - 0 21  Anxiety Difficulty Extremely difficult - -     Depression screen [facility] 2/9 01/06/2019 01/06/2019 12/15/2018  Decreased Interest 1 0 3  Down, Depressed, Hopeless 2 0 3  PHQ - 2 Score 3 0 6  Altered sleeping 3 - 3  Tired, decreased energy 2 - 3  Change in appetite 3 - 3  Feeling bad or failure about yourself  3 - 1  Trouble concentrating 3 - 0  Moving slowly or fidgety/restless 3 - 3  Suicidal thoughts 0 - 0  PHQ-9 Score 20 - 19  Difficult doing work/chores Extremely dIfficult - Very difficult    Fall Risk  01/06/2019 12/15/2018 07/07/2017 01/30/2017 12/30/2016  Falls in the past year? 0 0 No No No  Number falls in past yr: 0 0 - - -  Injury with Fall? 0 0 - - -  Follow up - Falls evaluation completed - - -     Allergies  Allergen Reactions  .  Oxycodone-Acetaminophen Itching and Nausea And Vomiting    Prior to Admission medications   Medication Sig Start Date End Date Taking? Authorizing Provider  cetirizine (ZYRTEC) 10 MG tablet Take 1 tablet (10 mg total) by mouth daily. 07/07/17  Yes Rutherford Guys, MD  FLUoxetine HCl 60 MG TABS Take 60 mg by mouth daily. 12/15/18  Yes Wendie Agreste, MD  fluticasone (FLONASE) 50 MCG/ACT nasal spray Place 1 spray into both nostrils 2 (two) times daily. 07/07/17  Yes Rutherford Guys, MD  hydrOXYzine (ATARAX/VISTARIL) 10 MG tablet 1-2 tablets at bedtime as needed 12/15/18  Yes Wendie Agreste, MD  LORazepam (ATIVAN) 0.5 MG tablet Take 1 tablet (0.5 mg total) by mouth 2 (two) times daily as needed (for severe panic attacks). 12/15/16  Yes Rutherford Guys, MD  metFORMIN (GLUCOPHAGE-XR) 500 MG 24 hr tablet Take 1 tablet (500 mg total) by mouth daily with breakfast. 12/15/18  Yes Wendie Agreste, MD  nitrofurantoin, macrocrystal-monohydrate, (MACROBID) 100 MG capsule Take 1 capsule (100 mg total) by mouth 2 (two) times daily. 12/15/18  Yes Wendie Agreste, MD  phenazopyridine (PYRIDIUM) 100 MG tablet Take 1 tablet (100 mg total) by mouth 3 (three)  times daily as needed for pain. 12/15/18  Yes Shade Flood, MD    Past Medical History:  Diagnosis Date  . Anemia   . Anxiety   . History of multiple miscarriages   . PCOS (polycystic ovarian syndrome)   . Recurrent UTI     History reviewed. No pertinent surgical history.  Social History   Tobacco Use  . Smoking status: Current Every Day Smoker    Types: Cigarettes    Last attempt to quit: 05/11/2016    Years since quitting: 2.6  . Smokeless tobacco: Never Used  . Tobacco comment: 1 pack per week  Substance Use Topics  . Alcohol use: Yes    Alcohol/week: 4.0 standard drinks    Types: 4 Glasses of wine per week    Family History  Problem Relation Age of Onset  . Hypertension Mother   . Hypertension Maternal Grandmother     ROS  Per hpi  OBJECTIVE:  Today's Vitals   01/06/19 1022  BP: 120/86  Pulse: 89  Temp: 98.5 F (36.9 C)  SpO2: 96%  Weight: 183 lb 9.6 oz (83.3 kg)  Height: 5\' 4"  (1.626 m)   Body mass index is 31.51 kg/m.   Physical Exam Vitals signs and nursing note reviewed.  Constitutional:      Appearance: She is well-developed.  HENT:     Head: Normocephalic and atraumatic.  Eyes:     General: No scleral icterus.    Conjunctiva/sclera: Conjunctivae normal.     Pupils: Pupils are equal, round, and reactive to light.  Neck:     Musculoskeletal: Neck supple.  Pulmonary:     Effort: Pulmonary effort is normal.  Skin:    General: Skin is warm and dry.  Neurological:     Mental Status: She is alert and oriented to person, place, and time.     No results found for this or any previous visit (from the past 24 hour(s)).  No results found.   ASSESSMENT and PLAN  1. Anxiety and depression meds just restarted, referring to counseling. Denies SI. Re-eval at next OV, consider aug with buspar given anxiety is main issue - Ambulatory referral to Psychology  2. Psychophysiological insomnia - hydrOXYzine (ATARAX/VISTARIL) 10 MG tablet; 1-2 tablets at bedtime as needed  3. PCOS (polycystic ovarian syndrome) Cont weight loss, discussed contraception for endometrial protection. She is not ready to start COC yet.  - metFORMIN (GLUCOPHAGE-XR) 500 MG 24 hr tablet; Take 1 tablet (500 mg total) by mouth daily with breakfast.  Other orders - FLUoxetine (PROZAC) 20 MG capsule; Take 3 capsules (60 mg total) by mouth daily.  Return in about 3 weeks (around 01/27/2019).    13/07/2018, MD Primary Care at [facility] [address] [place], [place] Ph.  [phone] Fax [phone]

## 2019-01-06 NOTE — Patient Instructions (Signed)
° ° ° °  If you have lab work done today you will be contacted with your lab results within the next 2 weeks.  If you have not heard from us then please contact us. The fastest way to get your results is to register for My Chart. ° ° °IF you received an x-ray today, you will receive an invoice from [facility]. Please contact [facility] at [phone] with questions or concerns regarding your invoice.  ° °IF you received labwork today, you will receive an invoice from [facility]. Please contact [facility] at [phone] with questions or concerns regarding your invoice.  ° °Our billing staff will not be able to assist you with questions regarding bills from these companies. ° °You will be contacted with the lab results as soon as they are available. The fastest way to get your results is to activate your My Chart account. Instructions are located on the last page of this paperwork. If you have not heard from us regarding the results in 2 weeks, please contact this office. °  ° ° ° °

## 2019-01-08 LAB — NOVEL CORONAVIRUS, NAA: SARS-CoV-2, NAA: NOT DETECTED

## 2019-01-11 ENCOUNTER — Telehealth: Payer: Self-pay | Admitting: Family Medicine

## 2019-01-11 NOTE — Telephone Encounter (Signed)
FAXED REF [place] [phone] 10.20.2020 FR

## 2019-01-27 ENCOUNTER — Ambulatory Visit (INDEPENDENT_AMBULATORY_CARE_PROVIDER_SITE_OTHER): Payer: BC Managed Care – PPO | Admitting: Family Medicine

## 2019-01-27 ENCOUNTER — Other Ambulatory Visit: Payer: Self-pay

## 2019-01-27 ENCOUNTER — Encounter: Payer: Self-pay | Admitting: Family Medicine

## 2019-01-27 VITALS — BP 121/84 | HR 87 | Temp 98.6°F | Ht 64.0 in | Wt 184.0 lb

## 2019-01-27 DIAGNOSIS — N898 Other specified noninflammatory disorders of vagina: Secondary | ICD-10-CM | POA: Diagnosis not present

## 2019-01-27 DIAGNOSIS — F5104 Psychophysiologic insomnia: Secondary | ICD-10-CM

## 2019-01-27 DIAGNOSIS — F410 Panic disorder [episodic paroxysmal anxiety] without agoraphobia: Secondary | ICD-10-CM

## 2019-01-27 DIAGNOSIS — F411 Generalized anxiety disorder: Secondary | ICD-10-CM

## 2019-01-27 DIAGNOSIS — F332 Major depressive disorder, recurrent severe without psychotic features: Secondary | ICD-10-CM

## 2019-01-27 MED ORDER — TRAZODONE HCL 50 MG PO TABS: 25.0000 mg | ORAL_TABLET | Freq: Every evening | ORAL | 3 refills | Status: DC | PRN

## 2019-01-27 MED ORDER — ESCITALOPRAM OXALATE 20 MG PO TABS: 20.0000 mg | ORAL_TABLET | Freq: Every day | ORAL | 2 refills | Status: DC

## 2019-01-27 MED ORDER — HYDROXYZINE HCL 25 MG PO TABS: 25.0000 mg | ORAL_TABLET | Freq: Three times a day (TID) | ORAL | 2 refills | Status: DC | PRN

## 2019-01-27 NOTE — Progress Notes (Signed)
11/5/20209:51 AM  Samantha Jimenez 1990/10/25, 28 y.o., female [id number]  Chief Complaint  Patient presents with  . Anxiety    Pt stated---the medication is not working.  . Depression    HPI:   Patient is a 28 y.o. female with past medical history significant for PCOS, anxiety and depression who presents today for routine followup  Last oV 3 weeks ago She has just restarted fluoxetine and vistaril Feels that she is not getting better She is doing counseling through her work, its ok but not the best connection Cost is a concern Not sleeping well On prozac 60mg  for a month Vistaril not helping at all Has lost 2 jobs due to mood issues  Neg MDQ   GAD 7 : Generalized Anxiety Score 01/27/2019 01/06/2019 12/30/2016 12/15/2016  Nervous, Anxious, on Edge 3 3 0 3  Control/stop worrying 3 3 0 3  Worry too much - different things 3 3 0 3  Trouble relaxing 3 3 0 3  Restless 0 3 0 3  Easily annoyed or irritable 3 3 0 3  Afraid - awful might happen 1 - 0 3  Total GAD 7 Score 16 - 0 21  Anxiety Difficulty Somewhat difficult Extremely difficult - -     Depression screen [facility] 2/9 01/27/2019 01/06/2019 01/06/2019  Decreased Interest 3 1 0  Down, Depressed, Hopeless 3 2 0  PHQ - 2 Score 6 3 0  Altered sleeping 3 3 -  Tired, decreased energy 3 2 -  Change in appetite 3 3 -  Feeling bad or failure about yourself  3 3 -  Trouble concentrating 3 3 -  Moving slowly or fidgety/restless 0 3 -  Suicidal thoughts 0 0 -  PHQ-9 Score 21 20 -  Difficult doing work/chores Somewhat difficult Extremely dIfficult -   She has been having vaginal irritation and odor for past 3 days, no odor Reminds her of previous BV infections  Fall Risk  01/27/2019 01/06/2019 12/15/2018 07/07/2017 01/30/2017  Falls in the past year? 0 0 0 No No  Number falls in past yr: 0 0 0 - -  Injury with Fall? 0 0 0 - -  Follow up - - Falls evaluation completed - -     Allergies  Allergen Reactions  .  Oxycodone-Acetaminophen Itching and Nausea And Vomiting    Prior to Admission medications   Medication Sig Start Date End Date Taking? Authorizing Provider  cetirizine (ZYRTEC) 10 MG tablet Take 1 tablet (10 mg total) by mouth daily. 07/07/17  Yes Rutherford Guys, MD  FLUoxetine (PROZAC) 20 MG capsule Take 3 capsules (60 mg total) by mouth daily. 01/06/19  Yes Rutherford Guys, MD  fluticasone (FLONASE) 50 MCG/ACT nasal spray Place 1 spray into both nostrils 2 (two) times daily. 07/07/17  Yes Rutherford Guys, MD  hydrOXYzine (ATARAX/VISTARIL) 10 MG tablet 1-2 tablets at bedtime as needed 01/06/19  Yes Rutherford Guys, MD  LORazepam (ATIVAN) 0.5 MG tablet Take 1 tablet (0.5 mg total) by mouth 2 (two) times daily as needed (for severe panic attacks). 12/15/16  Yes Rutherford Guys, MD  metFORMIN (GLUCOPHAGE-XR) 500 MG 24 hr tablet Take 1 tablet (500 mg total) by mouth daily with breakfast. 01/06/19  Yes Rutherford Guys, MD    Past Medical History:  Diagnosis Date  . Anemia   . Anxiety   . History of multiple miscarriages   . PCOS (polycystic ovarian syndrome)   . Recurrent UTI  No past surgical history on file.  Social History   Tobacco Use  . Smoking status: Current Every Day Smoker    Types: Cigarettes    Last attempt to quit: 05/11/2016    Years since quitting: 2.7  . Smokeless tobacco: Never Used  . Tobacco comment: 1 pack per week  Substance Use Topics  . Alcohol use: Yes    Alcohol/week: 4.0 standard drinks    Types: 4 Glasses of wine per week    Family History  Problem Relation Age of Onset  . Hypertension Mother   . Hypertension Maternal Grandmother     ROS Per hpi  OBJECTIVE:  Today's Vitals   01/27/19 0948  BP: 121/84  Pulse: 87  Temp: 98.6 F (37 C)  SpO2: 96%  Weight: 184 lb (83.5 kg)  Height: 5\' 4"  (1.626 m)   Body mass index is 31.58 kg/m.   Physical Exam Vitals signs and nursing note reviewed.  Constitutional:      Appearance: She is  well-developed.  HENT:     Head: Normocephalic and atraumatic.  Eyes:     General: No scleral icterus.    Conjunctiva/sclera: Conjunctivae normal.     Pupils: Pupils are equal, round, and reactive to light.  Neck:     Musculoskeletal: Neck supple.  Pulmonary:     Effort: Pulmonary effort is normal.  Skin:    General: Skin is warm and dry.  Neurological:     Mental Status: She is alert and oriented to person, place, and time.  Psychiatric:        Mood and Affect: Mood is depressed. Affect is blunt.     No results found for this or any previous visit (from the past 24 hour(s)).  No results found.   ASSESSMENT and PLAN  1. Severe episode of recurrent major depressive disorder, without psychotic features (HCC) Uncontrolled. Denies SI. Discussed treatment options, switching to lexapro. Reviewed r/se/b. Provided info for [facility] for psychiatry. ER precautions given.  - escitalopram (LEXAPRO) 20 MG tablet; Take 1 tablet (20 mg total) by mouth at bedtime.  2. Generalized anxiety disorder with panic attacks Tolerating but not effective, increasing dose. Reviewed r/se/b.  - hydrOXYzine (ATARAX/VISTARIL) 25 MG tablet; Take 1-2 tablets (25-50 mg total) by mouth every 8 (eight) hours as needed for anxiety.  3. Psychophysiological insomnia lexapro might be enough to help with insomnia, if not, trial of trazodone. Reviewed r/se/b - traZODone (DESYREL) 50 MG tablet; Take 0.5-1 tablets (25-50 mg total) by mouth at bedtime as needed for sleep.  4. Vaginal irritation Discussed avoidance of vaginal irritants, supportive care, wet prep pending. - Cervicovaginal ancillary only  Return in about 4 weeks (around 02/24/2019).     14/05/2018, MD Primary Care at [facility] [address] [place], [place] Ph.  [phone] Fax [phone]

## 2019-01-27 NOTE — Patient Instructions (Addendum)
Psychiatry services at sliding fee [facility], [phone]  If you have lab work done today you will be contacted with your lab results within the next 2 weeks.  If you have not heard from [place] then please contact us. The fastest way to get your results is to register for My Chart.   IF you received an x-ray today, you will receive an invoice from [facility]. Please contact [facility] at [phone] with questions or concerns regarding your invoice.   IF you received labwork today, you will receive an invoice from [facility]. Please contact [facility] at [phone] with questions or concerns regarding your invoice.   Our billing staff will not be able to assist you with questions regarding bills from these companies.  You will be contacted with the lab results as soon as they are available. The fastest way to get your results is to activate your My Chart account. Instructions are located on the last page of this paperwork. If you have not heard from [place] regarding the results in 2 weeks, please contact this office.

## 2019-01-28 LAB — WET PREP FOR TRICH, YEAST, CLUE
Clue Cell Exam: POSITIVE — AB
Trichomonas Exam: NEGATIVE
Yeast Exam: NEGATIVE

## 2019-01-28 MED ORDER — METRONIDAZOLE 500 MG PO TABS: 500.0000 mg | ORAL_TABLET | Freq: Two times a day (BID) | ORAL | 0 refills | Status: DC

## 2019-01-28 NOTE — Addendum Note (Signed)
Addended by: Rutherford Guys on: 01/28/2019 12:42 PM   Modules accepted: Orders

## 2019-02-25 ENCOUNTER — Ambulatory Visit: Payer: BC Managed Care – PPO | Admitting: Family Medicine

## 2019-04-01 DIAGNOSIS — Z03818 Encounter for observation for suspected exposure to other biological agents ruled out: Secondary | ICD-10-CM | POA: Diagnosis not present

## 2020-02-22 HISTORY — PX: DILATION AND CURETTAGE OF UTERUS: SHX78

## 2020-03-02 ENCOUNTER — Telehealth: Payer: Self-pay

## 2020-03-02 NOTE — Telephone Encounter (Signed)
Called pt no answer LM letting her know avalible records at front desk   Copied from CRM [id number]. Topic: Medical Record Request - Other >> Jan 12, 2020  2:12 PM Leafy Ro wrote: Pt is calling and would like a copy of her immunization records

## 2020-03-02 NOTE — Telephone Encounter (Signed)
Copied from CRM [id number]. Topic: Medical Record Request - Other >> Jan 12, 2020  2:12 PM Leafy Ro wrote: Pt is calling and would like a copy of her immunization records

## 2020-05-02 ENCOUNTER — Telehealth: Payer: Self-pay | Admitting: Emergency Medicine

## 2020-05-02 NOTE — Telephone Encounter (Signed)
Copied from CRM [id number]. Topic: Medical Record Request - Other >> Jan 12, 2020  2:12 PM Leafy Ro wrote: Pt is calling and would like a copy of her immunization records completed

## 2020-07-05 NOTE — Telephone Encounter (Signed)
Opened in error

## 2020-07-19 ENCOUNTER — Encounter (HOSPITAL_BASED_OUTPATIENT_CLINIC_OR_DEPARTMENT_OTHER): Payer: Self-pay | Admitting: Obstetrics and Gynecology

## 2020-07-19 ENCOUNTER — Other Ambulatory Visit: Payer: Self-pay

## 2020-07-19 ENCOUNTER — Emergency Department (HOSPITAL_BASED_OUTPATIENT_CLINIC_OR_DEPARTMENT_OTHER)
Admission: EM | Admit: 2020-07-19 | Discharge: 2020-07-19 | Disposition: A | Discharge status: Home / Self Care | Payer: BC Managed Care – PPO | Attending: Emergency Medicine | Admitting: Emergency Medicine

## 2020-07-19 DIAGNOSIS — N644 Mastodynia: Secondary | ICD-10-CM | POA: Insufficient documentation

## 2020-07-19 DIAGNOSIS — R1084 Generalized abdominal pain: Secondary | ICD-10-CM | POA: Insufficient documentation

## 2020-07-19 DIAGNOSIS — F17210 Nicotine dependence, cigarettes, uncomplicated: Secondary | ICD-10-CM | POA: Insufficient documentation

## 2020-07-19 LAB — CBC WITH DIFFERENTIAL/PLATELET
Abs Immature Granulocytes: 0.01 10*3/uL (ref 0.00–0.07)
Basophils Absolute: 0.1 10*3/uL (ref 0.0–0.1)
Basophils Relative: 1 %
Eosinophils Absolute: 0.4 10*3/uL (ref 0.0–0.5)
Eosinophils Relative: 5 %
HCT: 41.6 % (ref 36.0–46.0)
Hemoglobin: 14.3 g/dL (ref 12.0–15.0)
Immature Granulocytes: 0 %
Lymphocytes Relative: 52 %
Lymphs Abs: 4.1 10*3/uL — ABNORMAL HIGH (ref 0.7–4.0)
MCH: 30.6 pg (ref 26.0–34.0)
MCHC: 34.4 g/dL (ref 30.0–36.0)
MCV: 88.9 fL (ref 80.0–100.0)
Monocytes Absolute: 0.6 10*3/uL (ref 0.1–1.0)
Monocytes Relative: 7 %
Neutro Abs: 2.8 10*3/uL (ref 1.7–7.7)
Neutrophils Relative %: 35 %
Platelets: 271 10*3/uL (ref 150–400)
RBC: 4.68 MIL/uL (ref 3.87–5.11)
RDW: 12.2 % (ref 11.5–15.5)
WBC: 8 10*3/uL (ref 4.0–10.5)
nRBC: 0 % (ref 0.0–0.2)

## 2020-07-19 LAB — COMPREHENSIVE METABOLIC PANEL
ALT: 9 U/L (ref 0–44)
AST: 13 U/L — ABNORMAL LOW (ref 15–41)
Albumin: 4.6 g/dL (ref 3.5–5.0)
Alkaline Phosphatase: 66 U/L (ref 38–126)
Anion gap: 9 (ref 5–15)
BUN: 12 mg/dL (ref 6–20)
CO2: 26 mmol/L (ref 22–32)
Calcium: 9.1 mg/dL (ref 8.9–10.3)
Chloride: 103 mmol/L (ref 98–111)
Creatinine, Ser: 0.68 mg/dL (ref 0.44–1.00)
GFR, Estimated: >60 mL/min (ref >60)
Glucose, Bld: 87 mg/dL (ref 70–99)
Potassium: 3.8 mmol/L (ref 3.5–5.1)
Sodium: 138 mmol/L (ref 135–145)
Total Bilirubin: 0.4 mg/dL (ref 0.3–1.2)
Total Protein: 7.4 g/dL (ref 6.5–8.1)

## 2020-07-19 LAB — WET PREP, GENITAL
Clue Cells Wet Prep HPF POC: NONE SEEN
Sperm: NONE SEEN
Trich, Wet Prep: NONE SEEN
Yeast Wet Prep HPF POC: NONE SEEN

## 2020-07-19 LAB — URINALYSIS, ROUTINE W REFLEX MICROSCOPIC
Bilirubin Urine: NEGATIVE
Glucose, UA: NEGATIVE mg/dL
Hgb urine dipstick: NEGATIVE
Ketones, ur: NEGATIVE mg/dL
Nitrite: NEGATIVE
Protein, ur: 30 mg/dL — AB
Specific Gravity, Urine: 1.039 — ABNORMAL HIGH (ref 1.005–1.030)
Trans Epithel, UA: <1
pH: 6 (ref 5.0–8.0)

## 2020-07-19 LAB — PREGNANCY, URINE: Preg Test, Ur: NEGATIVE

## 2020-07-19 LAB — LIPASE, BLOOD: Lipase: 23 U/L (ref 11–51)

## 2020-07-19 MED ORDER — SODIUM CHLORIDE 0.9 % IV BOLUS
1000.0000 mL | Freq: Once | INTRAVENOUS | Status: AC
Start: 2020-07-19 — End: 2020-07-19
  Administered 2020-07-19: 1000 mL via INTRAVENOUS

## 2020-07-19 MED ORDER — IBUPROFEN 600 MG PO TABS: 600.0000 mg | ORAL_TABLET | Freq: Four times a day (QID) | ORAL | 0 refills | Status: DC | PRN

## 2020-07-19 NOTE — ED Provider Notes (Addendum)
[facility] EMERGENCY DEPT Provider Note   CSN: [id number] Arrival date & time: 07/19/20  2105     History Chief Complaint  Patient presents with  . Abdominal Cramping  . Breast Pain    Samantha Jimenez is a 30 y.o. female.  Pt presents to the ED today with lower abdominal cramping and some breast pain.  She is late for her period.  She has taken 2 pregnancy tests at home which were negative.  She has had some thick vaginal d/c.  She is still paying off her D&C in November from her obgyn, so she has not seen her gyn.        Past Medical History:  Diagnosis Date  . Anemia   . Anxiety   . History of multiple miscarriages   . PCOS (polycystic ovarian syndrome)   . Recurrent UTI     Patient Active Problem List   Diagnosis Date Noted  . Anemia 02/02/2018  . Anxiety 02/02/2018  . PCOS (polycystic ovarian syndrome) 02/02/2018  . Recurrent UTI 02/02/2018  . History of multiple miscarriages 02/02/2018  . Generalized anxiety disorder with panic attacks 12/15/2016    History reviewed. No pertinent surgical history.   OB History    Gravida  3   Para      Term      Preterm      AB  3   Living  0     SAB  3   IAB      Ectopic      Multiple      Live Births              Family History  Problem Relation Age of Onset  . Hypertension Mother   . Hypertension Maternal Grandmother     Social History   Tobacco Use  . Smoking status: Current Every Day Smoker    Types: Cigarettes    Last attempt to quit: 05/11/2016    Years since quitting: 4.1  . Smokeless tobacco: Never Used  . Tobacco comment: 1 pack per week  Vaping Use  . Vaping Use: Some days  . Substances: Nicotine, Flavoring  Substance Use Topics  . Alcohol use: Yes    Alcohol/week: 4.0 standard drinks    Types: 4 Glasses of wine per week  . Drug use: Yes    Types: Marijuana    Home Medications Prior to Admission medications   Medication Sig Start Date End Date Taking?  Authorizing Provider  ibuprofen (ADVIL) 600 MG tablet Take 1 tablet (600 mg total) by mouth every 6 (six) hours as needed. 07/19/20  Yes Jacalyn Lefevre, MD  cetirizine (ZYRTEC) 10 MG tablet Take 1 tablet (10 mg total) by mouth daily. 07/07/17   Noni Saupe, MD  escitalopram (LEXAPRO) 20 MG tablet Take 1 tablet (20 mg total) by mouth at bedtime. 01/27/19   Lezlie Lye, Meda Coffee, MD  fluticasone [facility]) 50 MCG/ACT nasal spray Place 1 spray into both nostrils 2 (two) times daily. 07/07/17   Noni Saupe, MD  hydrOXYzine (ATARAX/VISTARIL) 25 MG tablet Take 1-2 tablets (25-50 mg total) by mouth every 8 (eight) hours as needed for anxiety. 01/27/19   Lezlie Lye, Meda Coffee, MD  LORazepam (ATIVAN) 0.5 MG tablet Take 1 tablet (0.5 mg total) by mouth 2 (two) times daily as needed (for severe panic attacks). 12/15/16   Noni Saupe, MD  metFORMIN (GLUCOPHAGE-XR) 500 MG 24 hr tablet Take 1 tablet (500 mg total)  by mouth daily with breakfast. 01/06/19   Lezlie Lye, Meda Coffee, MD  metroNIDAZOLE (FLAGYL) 500 MG tablet Take 1 tablet (500 mg total) by mouth 2 (two) times daily. 01/28/19   Noni Saupe, MD  traZODone (DESYREL) 50 MG tablet Take 0.5-1 tablets (25-50 mg total) by mouth at bedtime as needed for sleep. 01/27/19   Noni Saupe, MD    Allergies    Oxycodone-acetaminophen  Review of Systems   Review of Systems  Gastrointestinal: Positive for abdominal pain.  Genitourinary: Positive for vaginal discharge.  All other systems reviewed and are negative.   Physical Exam Updated Vital Signs BP 119/89 (BP Location: Right Arm)   Pulse 75   Temp 98.5 F (36.9 C) (Oral)   Resp 18   Ht 5\' 4"  (1.626 m)   LMP 06/04/2020 (Exact Date)   SpO2 100%   BMI 31.58 kg/m   Physical Exam Vitals and nursing note reviewed. Exam conducted with a chaperone present.  Constitutional:      Appearance: Normal appearance.  HENT:     Head: Normocephalic and atraumatic.      Right Ear: External ear normal.     Left Ear: External ear normal.     Nose: Nose normal.     Mouth/Throat:     Mouth: Mucous membranes are moist.     Pharynx: Oropharynx is clear.  Eyes:     Extraocular Movements: Extraocular movements intact.     Conjunctiva/sclera: Conjunctivae normal.     Pupils: Pupils are equal, round, and reactive to light.  Cardiovascular:     Rate and Rhythm: Normal rate and regular rhythm.     Pulses: Normal pulses.     Heart sounds: Normal heart sounds.  Pulmonary:     Effort: Pulmonary effort is normal.     Breath sounds: Normal breath sounds.  Abdominal:     General: Abdomen is flat. Bowel sounds are normal.     Palpations: Abdomen is soft.  Genitourinary:    Exam position: Lithotomy position.     Cervix: Discharge present.     Comments: Thick, white d/c Musculoskeletal:        General: Normal range of motion.     Cervical back: Normal range of motion and neck supple.  Skin:    General: Skin is warm.     Capillary Refill: Capillary refill takes less than 2 seconds.  Neurological:     General: No focal deficit present.     Mental Status: She is alert and oriented to person, place, and time.  Psychiatric:        Mood and Affect: Mood normal.        Thought Content: Thought content normal.        Judgment: Judgment normal.     ED Results / Procedures / Treatments   Labs (all labs ordered are listed, but only abnormal results are displayed) Labs Reviewed  WET PREP, GENITAL - Abnormal; Notable for the following components:      Result Value   WBC, Wet Prep HPF POC MANY (*)    All other components within normal limits  CBC WITH DIFFERENTIAL/PLATELET - Abnormal; Notable for the following components:   Lymphs Abs 4.1 (*)    All other components within normal limits  COMPREHENSIVE METABOLIC PANEL - Abnormal; Notable for the following components:   AST 13 (*)    All other components within normal limits  URINALYSIS, ROUTINE W REFLEX  MICROSCOPIC - Abnormal; Notable for  the following components:   Specific Gravity, Urine 1.039 (*)    Protein, ur 30 (*)    Leukocytes,Ua SMALL (*)    Bacteria, UA RARE (*)    All other components within normal limits  LIPASE, BLOOD  PREGNANCY, URINE  GC/CHLAMYDIA PROBE AMP ([place]) NOT AT [facility]    EKG None  Radiology No results found.  Procedures Procedures   Medications Ordered in ED Medications  sodium chloride 0.9 % bolus 1,000 mL (1,000 mLs Intravenous New Bag/Given 07/19/20 2142)    ED Course  I have reviewed the triage vital signs and the nursing notes.  Pertinent labs & imaging results that were available during my care of the patient were reviewed by me and considered in my medical decision making (see chart for details).    MDM Rules/Calculators/A&P                           Pt offered treatment for chlamydia and gonorrhea, but she declines.  Period late probably from PCOS.  Labs are unremarkable.  Pt is stable for d/c.  [facility]  Return if worse. Final Clinical Impression(s) / ED Diagnoses Final diagnoses:  Generalized abdominal pain    Rx / DC Orders ED Discharge Orders         Ordered    ibuprofen (ADVIL) 600 MG tablet  Every 6 hours PRN        07/19/20 2241           Jacalyn Lefevre, MD 07/19/20 2242    Jacalyn Lefevre, MD 07/19/20 2244

## 2020-07-19 NOTE — ED Triage Notes (Signed)
Patient reports cramping in her lower quadrants down into the pelvic region. Patient reports a hx of PCOS and is concerned for possible cancer. Patient reports she feels the need to defecate but her BM's have been abnormal. Patient reports the same partner x18 months with no changes. Patient reports thick vaginal d/c.

## 2020-07-23 LAB — GC/CHLAMYDIA PROBE AMP (~~LOC~~) NOT AT ARMC
Chlamydia: NEGATIVE
Comment: NEGATIVE
Comment: NORMAL
Neisseria Gonorrhea: NEGATIVE

## 2021-02-06 ENCOUNTER — Encounter (HOSPITAL_COMMUNITY): Payer: Self-pay | Admitting: Obstetrics and Gynecology

## 2021-02-06 ENCOUNTER — Other Ambulatory Visit: Payer: Self-pay

## 2021-02-06 ENCOUNTER — Inpatient Hospital Stay (HOSPITAL_COMMUNITY)
Admission: AD | Admit: 2021-02-06 | Discharge: 2021-02-06 | Disposition: A | Discharge status: Home / Self Care | Payer: Medicaid Other | Attending: Obstetrics and Gynecology | Admitting: Obstetrics and Gynecology

## 2021-02-06 ENCOUNTER — Inpatient Hospital Stay (HOSPITAL_COMMUNITY): Payer: Medicaid Other

## 2021-02-06 DIAGNOSIS — Z3A10 10 weeks gestation of pregnancy: Secondary | ICD-10-CM | POA: Insufficient documentation

## 2021-02-06 DIAGNOSIS — O021 Missed abortion: Secondary | ICD-10-CM

## 2021-02-06 DIAGNOSIS — N939 Abnormal uterine and vaginal bleeding, unspecified: Secondary | ICD-10-CM

## 2021-02-06 DIAGNOSIS — O26851 Spotting complicating pregnancy, first trimester: Secondary | ICD-10-CM | POA: Insufficient documentation

## 2021-02-06 DIAGNOSIS — N9489 Other specified conditions associated with female genital organs and menstrual cycle: Secondary | ICD-10-CM | POA: Diagnosis not present

## 2021-02-06 DIAGNOSIS — Z7689 Persons encountering health services in other specified circumstances: Secondary | ICD-10-CM | POA: Insufficient documentation

## 2021-02-06 DIAGNOSIS — O3481 Maternal care for other abnormalities of pelvic organs, first trimester: Secondary | ICD-10-CM | POA: Diagnosis not present

## 2021-02-06 DIAGNOSIS — O364XX0 Maternal care for intrauterine death, not applicable or unspecified: Secondary | ICD-10-CM | POA: Diagnosis not present

## 2021-02-06 LAB — URINALYSIS, ROUTINE W REFLEX MICROSCOPIC
Bilirubin Urine: NEGATIVE
Glucose, UA: NEGATIVE mg/dL
Hgb urine dipstick: NEGATIVE
Ketones, ur: NEGATIVE mg/dL
Nitrite: NEGATIVE
Protein, ur: NEGATIVE mg/dL
Specific Gravity, Urine: 1.02 (ref 1.005–1.030)
pH: 6 (ref 5.0–8.0)

## 2021-02-06 LAB — POCT PREGNANCY, URINE: Preg Test, Ur: POSITIVE — AB

## 2021-02-06 NOTE — MAU Provider Note (Signed)
History     CSN: [id number]  Arrival date and time: 02/06/21 1735   None     No chief complaint on file.  HPI  [name] is a 30 y.o. female G5P0040 @ [redacted]w[redacted]d with a significant SAB history here for a 2nd opinion. She was in the office at [facility] on Thursday and was told that her baby measured 8w and did not have a heartbeat. She has no complaints or concerns, just wants piece of mind.   Hx of clotting disorder which was diagnosed at age 4.   OB History     Gravida  5   Para      Term      Preterm      AB  4   Living  0      SAB  4   IAB      Ectopic      Multiple      Live Births              Past Medical History:  Diagnosis Date   Anemia    Anxiety    History of multiple miscarriages    PCOS (polycystic ovarian syndrome)    Recurrent UTI     Past Surgical History:  Procedure Laterality Date   DILATION AND CURETTAGE OF UTERUS      Family History  Problem Relation Age of Onset   Hypertension Mother    Hypertension Maternal Grandmother     Social History   Tobacco Use   Smoking status: Every Day    Types: Cigarettes    Last attempt to quit: 05/11/2016    Years since quitting: 4.7   Smokeless tobacco: Never   Tobacco comments:    1 pack per week  Vaping Use   Vaping Use: Some days   Substances: Nicotine, Flavoring  Substance Use Topics   Alcohol use: Yes    Alcohol/week: 4.0 standard drinks    Types: 4 Glasses of wine per week   Drug use: Yes    Types: Marijuana    Allergies:  Allergies  Allergen Reactions   Oxycodone-Acetaminophen Itching and Nausea And Vomiting    Medications Prior to Admission  Medication Sig Dispense Refill Last Dose   cetirizine (ZYRTEC) 10 MG tablet Take 1 tablet (10 mg total) by mouth daily. 30 tablet 11 Unknown   escitalopram (LEXAPRO) 20 MG tablet Take 1 tablet (20 mg total) by mouth at bedtime. 30 tablet 2 Unknown   fluticasone (FLONASE) 50 MCG/ACT nasal spray Place 1 spray  into both nostrils 2 (two) times daily. 16 g 6 Unknown   hydrOXYzine (ATARAX/VISTARIL) 25 MG tablet Take 1-2 tablets (25-50 mg total) by mouth every 8 (eight) hours as needed for anxiety. 60 tablet 2 Unknown   ibuprofen (ADVIL) 600 MG tablet Take 1 tablet (600 mg total) by mouth every 6 (six) hours as needed. 30 tablet 0    LORazepam (ATIVAN) 0.5 MG tablet Take 1 tablet (0.5 mg total) by mouth 2 (two) times daily as needed (for severe panic attacks). 6 tablet 0    metFORMIN (GLUCOPHAGE-XR) 500 MG 24 hr tablet Take 1 tablet (500 mg total) by mouth daily with breakfast. 30 tablet 3 Unknown   metroNIDAZOLE (FLAGYL) 500 MG tablet Take 1 tablet (500 mg total) by mouth 2 (two) times daily. 14 tablet 0 Unknown   traZODone (DESYREL) 50 MG tablet Take 0.5-1 tablets (25-50 mg total) by mouth at bedtime as needed for sleep. 30 tablet  3 Unknown   Results for orders placed or performed during the hospital encounter of 02/06/21 (from the past 48 hour(s))  Pregnancy, urine POC     Status: Abnormal   Collection Time: 02/06/21  6:52 PM  Result Value Ref Range   Preg Test, Ur POSITIVE (A) NEGATIVE    Comment:        THE SENSITIVITY OF THIS METHODOLOGY IS >24 mIU/mL   Urinalysis, Routine w reflex microscopic Urine, Clean Catch     Status: Abnormal   Collection Time: 02/06/21  7:02 PM  Result Value Ref Range   Color, Urine YELLOW YELLOW   APPearance HAZY (A) CLEAR   Specific Gravity, Urine 1.020 1.005 - 1.030   pH 6.0 5.0 - 8.0   Glucose, UA NEGATIVE NEGATIVE mg/dL   Hgb urine dipstick NEGATIVE NEGATIVE   Bilirubin Urine NEGATIVE NEGATIVE   Ketones, ur NEGATIVE NEGATIVE mg/dL   Protein, ur NEGATIVE NEGATIVE mg/dL   Nitrite NEGATIVE NEGATIVE   Leukocytes,Ua LARGE (A) NEGATIVE   RBC / HPF 0-5 0 - 5 RBC/hpf   WBC, UA 6-10 0 - 5 WBC/hpf   Bacteria, UA FEW (A) NONE SEEN   Squamous Epithelial / LPF 6-10 0 - 5   Mucus PRESENT     Comment: Performed at [facility], [address], [place], [place]  [address]   US OB LESS THAN 14 WEEKS WITH OB TRANSVAGINAL  Result Date: 02/06/2021 CLINICAL DATA:  Pelvic and low back pain and spotting in the first trimester. EXAM: OBSTETRIC <14 WK [place] AND TRANSVAGINAL OB US DOPPLER ULTRASOUND OF OVARIES TECHNIQUE: Both transabdominal and transvaginal ultrasound examinations were performed for complete evaluation of the gestation as well as the maternal uterus, adnexal regions, and pelvic cul-de-sac. Transvaginal technique was performed to assess early pregnancy. Color Doppler ultrasound was utilized to evaluate blood flow to the ovaries. COMPARISON:  None. FINDINGS: Intrauterine gestational sac: Single Yolk sac:  Not Visualized. Embryo:  Visualized. Cardiac Activity: Not Visualized.  Pulseless fetus. Heart Rate: 0 bpm MSD: Not measured. CRL:   18.7 mm   8 w 2 d                  [place] EDC: 09/16/2021 Subchorionic hemorrhage:  None visualized. Maternal uterus/adnexae: The uterus is anteverted. The cervix is closed measuring 2.8 cm in length with tiny nabothian cysts. The developing placenta is posterior/fundal without a visible previa are underlying collection. No uterine mass is seen. The ovaries are unremarkable and normal in size. No free fluid or adnexal mass or observed. Color doppler evaluation of both ovaries demonstrates normal appearing color flow. IMPRESSION: Pulseless fetus measuring 8 weeks 2 days consistent with intrauterine fetal demise. By LMP the gestational age should be 10 weeks 6 days according to technologist notes. No subchorionic bleed or placenta previa are observed. The cervix is closed. There is no adnexal mass visible or free fluid. Electronically Signed   By: Almira Bar M.D.   On: 02/06/2021 20:16     Review of Systems  Gastrointestinal:  Negative for abdominal pain.  Genitourinary:  Negative for vaginal bleeding.  Physical Exam   Blood pressure 121/73, pulse 85, temperature 98.8 F (37.1 C), temperature source Oral, resp. rate 20, height 5\' 4"   (1.626 m), weight 73.9 kg, last menstrual period 11/22/2020.  Physical Exam Constitutional:      Appearance: Normal appearance.  HENT:     Head: Normocephalic.  Skin:    General: Skin is warm.  Neurological:  Mental Status: She is alert and oriented to person, place, and time.  Psychiatric:        Behavior: Behavior normal.   MAU Course  Procedures None  MDM  O positive blood type  Reviewed [place] results in detail with patient. Reviewed images and video of asystole, picture provided to the patient. Recommend ANORA testing with D&C on 11/18- number given for compassionate care application.     Assessment and Plan   A:  Encounter for assessment for fetal demise - Plan: Discharge patient  Vaginal spotting - Plan: US OB LESS THAN 14 WEEKS WITH OB TRANSVAGINAL, US OB LESS THAN 14 WEEKS WITH OB TRANSVAGINAL, Discharge patient, CANCELED: US OB Transvaginal, CANCELED: US OB Transvaginal  [redacted] weeks gestation of pregnancy - Plan: US OB LESS THAN 14 WEEKS WITH OB TRANSVAGINAL, US OB LESS THAN 14 WEEKS WITH OB TRANSVAGINAL, Discharge patient, CANCELED: US OB Transvaginal, CANCELED: US OB Transvaginal   P:  Discharge home Support given Return to MAU if needed  Venia Carbon I, NP 02/07/2021 1:42 PM

## 2021-02-06 NOTE — MAU Note (Signed)
Pt reports she has been having some sharp pain on and off for the past 2 week. Started spotting today when wiping. C/o lower back pain that is constant.

## 2021-02-07 ENCOUNTER — Encounter (HOSPITAL_BASED_OUTPATIENT_CLINIC_OR_DEPARTMENT_OTHER): Payer: Self-pay | Admitting: Obstetrics

## 2021-02-07 ENCOUNTER — Other Ambulatory Visit: Payer: Self-pay

## 2021-02-07 NOTE — Progress Notes (Signed)
During pre-op phone call, pt told me that she was diagnosed with a blood clotting disorder when she was 14. She did not know the name of the disorder. She stated that she had not had any problems related to it. Pt said she had some labwork done last November thru [facility] and was told that her blood may clot too easily. I can find no mention or documentation of this in her provider notes or lab results. Pt stated that she would email her lab results to me. Currently awaiting email from pt.    As of 02/07/21 @ 1726,  I have not received an email from patient.

## 2021-02-07 NOTE — Progress Notes (Signed)
Spoke w/ via phone for pre-op interview---pt Lab needs dos----CBC               Lab results------none COVID test -----patient states asymptomatic no test needed Arrive at -------0900 on 02/08/21 NPO after MN NO Solid Food.  Clear liquids from MN until---0800 Med rec completed Medications to take morning of surgery -----none Diabetic medication -----n/a Patient instructed no nail polish to be worn day of surgery Patient instructed to bring photo id and insurance card day of surgery Patient aware to have Driver (ride ) / caregiver    for 24 hours after surgery  Patient Special Instructions -----none Pre-Op special Istructions -----none Patient verbalized understanding of instructions that were given at this phone interview. Patient denies shortness of breath, chest pain, fever, cough at this phone interview.

## 2021-02-07 NOTE — H&P (Signed)
30 y.o. G6P0050 @ [redacted]w[redacted]d by LMP presents for [facility] for missed abortion.   She presented as a new patient and was found to have a missed abortion measuring 105w4d (22mm) without fetal cardiac activity.  Her history is significant for 5 prior first trimester miscarriages.  She has seen multiple different physicians for her other pregnancies Work up to date for the recurrent pregnancy loss has been fragmented and records are not available to access if it has been complete  Past Medical History:  Diagnosis Date   Anemia    Anxiety    Clotting disorder (HCC)    per 02/07/21 phone call with pt, she was diagnosed with a clotting disoder at age 60, later on another doctor told her that her blood may clot to easily, she had had no treatment and has not had any problems   Heart palpitations    Pt states that she has never been diagnosed , but she does feel that her heart flutters at times. per pt 02/07/21   History of multiple miscarriages    PCOS (polycystic ovarian syndrome)    Recurrent UTI     Past Surgical History:  Procedure Laterality Date   DILATION AND CURETTAGE OF UTERUS  02/2020    OB History  Gravida Para Term Preterm AB Living  6       5 0  SAB IAB Ectopic Multiple Live Births  5            # Outcome Date GA Lbr Len/2nd Weight Sex Delivery Anes PTL Lv  6 Current           5 SAB 2021 [redacted]w[redacted]d         4 SAB 2020 [redacted]w[redacted]d         3 SAB 2019 [redacted]w[redacted]d         2 SAB 2017 [redacted]w[redacted]d         1 SAB 2015 [redacted]w[redacted]d           Social History   Socioeconomic History   Marital status: Married    Spouse name: Not on file   Number of children: Not on file   Years of education: Not on file   Highest education level: Not on file  Occupational History   Not on file  Tobacco Use   Smoking status: Former    Types: Cigarettes    Quit date: 05/11/2016    Years since quitting: 4.7   Smokeless tobacco: Never   Tobacco comments:    1 pack per week  Vaping Use   Vaping Use: Former   Substances: Nicotine, Flavoring   Substance and Sexual Activity   Alcohol use: Yes    Alcohol/week: 4.0 standard drinks    Types: 4 Glasses of wine per week    Comment: occasionally   Drug use: Never    Comment: Pt denies former marijuana use. 02/07/2021   Sexual activity: Yes    Birth control/protection: None  Other Topics Concern   Not on file  Social History Narrative   Not on file   Social Determinants of Health   Financial Resource Strain: Not on file  Food Insecurity: Not on file  Transportation Needs: Not on file  Physical Activity: Not on file  Stress: Not on file  Social Connections: Not on file  Intimate Partner Violence: Not on file   Oxycodone-acetaminophen    Vitals:   02/08/21 0927  BP: 121/78  Pulse: 88  Resp: 16  Temp: 98.7 F (37.1 C)  SpO2: 100%     General:  NAD Abdomen:  soft Ex:  no edema FHTs:  Absent    A/P   30 y.o.  G6P0050 presents for dilation and curettage for missed abortion measuring 8 weeks Discussed options for expectant, medical and surgical management.  Elects for surgical management.  Discussed risks to include infection, bleeding, damage to surrounding structures (including but not limited to vagina, cervix, bladder, uterus), uterine perforation, need for additional procedures.  I recommend sending tissue for ANORA chromosome analysis given recurrent pregnancy loss.  Patient consents.  All questions answered and patient elects to proceed.  She will follow up in office following procedure after obtaining all medical records and anora results to discuss work up for recurrent pregnancy loss Rh: O positive  Burnie Therien GEFFEL [employer]

## 2021-02-08 ENCOUNTER — Encounter (HOSPITAL_BASED_OUTPATIENT_CLINIC_OR_DEPARTMENT_OTHER)
Admission: RE | Disposition: A | Discharge status: Home / Self Care | Payer: Self-pay | Source: Home / Self Care | Attending: Obstetrics

## 2021-02-08 ENCOUNTER — Other Ambulatory Visit: Payer: Self-pay

## 2021-02-08 ENCOUNTER — Ambulatory Visit (HOSPITAL_BASED_OUTPATIENT_CLINIC_OR_DEPARTMENT_OTHER): Payer: Medicaid Other | Admitting: Certified Registered"

## 2021-02-08 ENCOUNTER — Ambulatory Visit (HOSPITAL_BASED_OUTPATIENT_CLINIC_OR_DEPARTMENT_OTHER)
Admission: RE | Admit: 2021-02-08 | Discharge: 2021-02-08 | Disposition: A | Discharge status: Home / Self Care | Payer: Medicaid Other | Attending: Obstetrics | Admitting: Obstetrics

## 2021-02-08 ENCOUNTER — Encounter (HOSPITAL_BASED_OUTPATIENT_CLINIC_OR_DEPARTMENT_OTHER): Payer: Self-pay | Admitting: Obstetrics

## 2021-02-08 DIAGNOSIS — O99281 Endocrine, nutritional and metabolic diseases complicating pregnancy, first trimester: Secondary | ICD-10-CM | POA: Diagnosis not present

## 2021-02-08 DIAGNOSIS — E282 Polycystic ovarian syndrome: Secondary | ICD-10-CM | POA: Diagnosis not present

## 2021-02-08 DIAGNOSIS — Z87891 Personal history of nicotine dependence: Secondary | ICD-10-CM | POA: Insufficient documentation

## 2021-02-08 DIAGNOSIS — Z3A08 8 weeks gestation of pregnancy: Secondary | ICD-10-CM | POA: Diagnosis not present

## 2021-02-08 DIAGNOSIS — N96 Recurrent pregnancy loss: Secondary | ICD-10-CM

## 2021-02-08 DIAGNOSIS — O021 Missed abortion: Secondary | ICD-10-CM | POA: Insufficient documentation

## 2021-02-08 HISTORY — DX: Palpitations: R00.2

## 2021-02-08 HISTORY — PX: DILATION AND CURETTAGE OF UTERUS: SHX78

## 2021-02-08 HISTORY — DX: Coagulation defect, unspecified: D68.9

## 2021-02-08 LAB — CBC
HCT: 38.6 % (ref 36.0–46.0)
Hemoglobin: 13.4 g/dL (ref 12.0–15.0)
MCH: 31.1 pg (ref 26.0–34.0)
MCHC: 34.7 g/dL (ref 30.0–36.0)
MCV: 89.6 fL (ref 80.0–100.0)
Platelets: 262 10*3/uL (ref 150–400)
RBC: 4.31 MIL/uL (ref 3.87–5.11)
RDW: 12.6 % (ref 11.5–15.5)
WBC: 5 10*3/uL (ref 4.0–10.5)
nRBC: 0 % (ref 0.0–0.2)

## 2021-02-08 SURGERY — DILATION AND CURETTAGE
Anesthesia: General | Site: Vagina

## 2021-02-08 MED ORDER — SCOPOLAMINE 1 MG/3DAYS TD PT72
MEDICATED_PATCH | TRANSDERMAL | Status: DC | PRN
  Administered 2021-02-08: 1 via TRANSDERMAL

## 2021-02-08 MED ORDER — SCOPOLAMINE 1 MG/3DAYS TD PT72
MEDICATED_PATCH | TRANSDERMAL | Status: AC
  Filled 2021-02-08: qty 1

## 2021-02-08 MED ORDER — KETOROLAC TROMETHAMINE 30 MG/ML IJ SOLN
INTRAMUSCULAR | Status: DC | PRN
  Administered 2021-02-08: 30 mg via INTRAVENOUS

## 2021-02-08 MED ORDER — DROPERIDOL 2.5 MG/ML IJ SOLN
INTRAMUSCULAR | Status: DC | PRN
  Administered 2021-02-08: .625 mg via INTRAVENOUS

## 2021-02-08 MED ORDER — FENTANYL CITRATE (PF) 100 MCG/2ML IJ SOLN: 25.0000 ug | INTRAMUSCULAR | Status: DC | PRN

## 2021-02-08 MED ORDER — PROPOFOL 10 MG/ML IV BOLUS
INTRAVENOUS | Status: DC | PRN
  Administered 2021-02-08: 10 mg via INTRAVENOUS
  Administered 2021-02-08: 180 mg via INTRAVENOUS
  Administered 2021-02-08: 10 mg via INTRAVENOUS

## 2021-02-08 MED ORDER — MIDAZOLAM HCL 5 MG/5ML IJ SOLN
INTRAMUSCULAR | Status: DC | PRN
  Administered 2021-02-08: 2 mg via INTRAVENOUS

## 2021-02-08 MED ORDER — ONDANSETRON HCL 4 MG/2ML IJ SOLN
INTRAMUSCULAR | Status: DC | PRN
  Administered 2021-02-08: 4 mg via INTRAVENOUS

## 2021-02-08 MED ORDER — LIDOCAINE 2% (20 MG/ML) 5 ML SYRINGE
INTRAMUSCULAR | Status: DC | PRN
  Administered 2021-02-08: 100 mg via INTRAVENOUS

## 2021-02-08 MED ORDER — PROPOFOL 10 MG/ML IV BOLUS
INTRAVENOUS | Status: AC
  Filled 2021-02-08: qty 20

## 2021-02-08 MED ORDER — DOXYCYCLINE HYCLATE 100 MG IV SOLR
200.0000 mg | Freq: Once | INTRAVENOUS | Status: AC
  Administered 2021-02-08: 200 mg via INTRAVENOUS
  Filled 2021-02-08: qty 200

## 2021-02-08 MED ORDER — FENTANYL CITRATE (PF) 100 MCG/2ML IJ SOLN
INTRAMUSCULAR | Status: DC | PRN
  Administered 2021-02-08: 25 ug via INTRAVENOUS
  Administered 2021-02-08: 50 ug via INTRAVENOUS

## 2021-02-08 MED ORDER — FENTANYL CITRATE (PF) 100 MCG/2ML IJ SOLN
INTRAMUSCULAR | Status: AC
  Filled 2021-02-08: qty 2

## 2021-02-08 MED ORDER — DEXAMETHASONE SODIUM PHOSPHATE 10 MG/ML IJ SOLN
INTRAMUSCULAR | Status: DC | PRN
  Administered 2021-02-08: 10 mg via INTRAVENOUS

## 2021-02-08 MED ORDER — DROPERIDOL 2.5 MG/ML IJ SOLN
INTRAMUSCULAR | Status: AC
  Filled 2021-02-08: qty 2

## 2021-02-08 MED ORDER — MIDAZOLAM HCL 2 MG/2ML IJ SOLN
INTRAMUSCULAR | Status: AC
  Filled 2021-02-08: qty 2

## 2021-02-08 MED ORDER — LACTATED RINGERS IV SOLN
INTRAVENOUS | Status: DC
  Administered 2021-02-08: 1000 mL via INTRAVENOUS

## 2021-02-08 MED ORDER — PROMETHAZINE HCL 25 MG/ML IJ SOLN: 6.2500 mg | INTRAMUSCULAR | Status: DC | PRN

## 2021-02-08 MED ORDER — ACETAMINOPHEN 10 MG/ML IV SOLN: 1000.0000 mg | Freq: Once | INTRAVENOUS | Status: DC | PRN

## 2021-02-08 SURGICAL SUPPLY — 14 items
CATH ROBINSON RED A/P 16FR (CATHETERS) ×2 IMPLANT
CNTNR URN SCR LID CUP LEK RST (MISCELLANEOUS) ×1 IMPLANT
CONT SPEC 4OZ STRL OR WHT (MISCELLANEOUS) ×1
GAUZE 4X4 16PLY ~~LOC~~+RFID DBL (SPONGE) ×2 IMPLANT
GLOVE SURG LTX SZ6 (GLOVE) ×2 IMPLANT
GLOVE SURG UNDER POLY LF SZ6 (GLOVE) ×2 IMPLANT
GOWN STRL REUS W/TWL LRG LVL3 (GOWN DISPOSABLE) ×4 IMPLANT
KIT TURNOVER CYSTO (KITS) ×2 IMPLANT
PACK VAGINAL MINOR WOMEN LF (CUSTOM PROCEDURE TRAY) ×2 IMPLANT
PAD OB MATERNITY 4.3X12.25 (PERSONAL CARE ITEMS) ×2 IMPLANT
PAD PREP 24X48 CUFFED NSTRL (MISCELLANEOUS) ×2 IMPLANT
SET BERKELEY SUCTION TUBING (SUCTIONS) ×2 IMPLANT
TOWEL OR 17X26 10 PK STRL BLUE (TOWEL DISPOSABLE) ×2 IMPLANT
disposable rigid curette curved ×2 IMPLANT

## 2021-02-08 NOTE — Discharge Instructions (Addendum)
You make take ibuprofen 600 mg every 6 hours and acetaminophen 1000 mg every 6 hours.  You make take both medications together at the same time as needed.   DISCHARGE INSTRUCTIONS: D&C / D&E The following instructions have been prepared to help you care for yourself upon your return home.   Personal hygiene:  Use sanitary pads for vaginal drainage, not tampons.  Shower the day after your procedure.  NO tub baths, pools or Jacuzzis for 2-3 weeks.  Wipe front to back after using the bathroom.  Activity and limitations:  Do NOT drive or operate any equipment for 24 hours. The effects of anesthesia are still present and drowsiness may result.  Do NOT rest in bed all day.  Walking is encouraged.  Walk up and down stairs slowly.  You may resume your normal activity in one to two days or as indicated by your physician.  Sexual activity: NO intercourse for at least 2 weeks after the procedure, or as indicated by your physician.  Diet: Eat a light meal as desired this evening. You may resume your usual diet tomorrow.  Return to work: You may resume your work activities in one to two days or as indicated by your doctor.  What to expect after your surgery: Expect to have vaginal bleeding/discharge for 2-3 days and spotting for up to 10 days. It is not unusual to have soreness for up to 1-2 weeks. You may have a slight burning sensation when you urinate for the first day. Mild cramps may continue for a couple of days. You may have a regular period in 2-6 weeks.  Call your doctor for any of the following:  Excessive vaginal bleeding, saturating and changing one pad every hour.  Inability to urinate 6 hours after discharge from hospital.  Pain not relieved by pain medication.  Fever of 100.4 F or greater.  Unusual vaginal discharge or odor.  Post Anesthesia Home Care Instructions  Activity: Get plenty of rest for the remainder of the day. A responsible individual must stay with you for 24  hours following the procedure.  For the next 24 hours, DO NOT: -Drive a car -Advertising copywriter -Drink alcoholic beverages -Take any medication unless instructed by your physician -Make any legal decisions or sign important papers.  Meals: Start with liquid foods such as gelatin or soup. Progress to regular foods as tolerated. Avoid greasy, spicy, heavy foods. If nausea and/or vomiting occur, drink only clear liquids until the nausea and/or vomiting subsides. Call your physician if vomiting continues.  Special Instructions/Symptoms: Your throat may feel dry or sore from the anesthesia or the breathing tube placed in your throat during surgery. If this causes discomfort, gargle with warm salt water. The discomfort should disappear within 24 hours.  If you had a scopolamine patch placed behind your ear for the management of post- operative nausea and/or vomiting:  1. The medication in the patch is effective for 72 hours, after which it should be removed.  Wrap patch in a tissue and discard in the trash. Wash hands thoroughly with soap and water. 2. You may remove the patch earlier than 72 hours if you experience unpleasant side effects which may include dry mouth, dizziness or visual disturbances. 3. Avoid touching the patch. Wash your hands with soap and water after contact with the patch.  No ibuprofen, Advil, Aleve, Motrin, ketorolac, meloxicam, naproxen, or other NSAIDS until after 5:30 pm today if needed.

## 2021-02-08 NOTE — Op Note (Signed)
Pre-Operative Diagnosis: Missed abortion at [redacted]w[redacted]d  Postoperative Diagnosis: Missed abortion at [redacted]w[redacted]d  Procedure: Dilation and curettage  Surgeon: Marlow Baars, MD  Operative Findings: 8 week size anteverted uterus  Specimen: products of conception to pathology as well as for ANORA chromosome analysis  EBL: Minimal  Procedure:    After adequate anesthesia was achieved, the patient placed in the dorsal lithotomy position in [place] stirrups.  200mg  of doxycycline was administered preoperatively.  She was prepped and draped in the usual sterile fashion.  The bivalve speculum was placed in the vagina and the anterior lip of the cervix grasped with a single-tooth tenaculum.  The cervix was serially dilated with Hank dilators. . An 8 mm suction curette was advanced to the fundus, the vacuum was engaged, and multiple suction passes were performed until the products of conception were evacuated. A Sharp curettage was performed and a gritty texture was noted in all uterine quadrants. A final suction pass was performed with minimal results. This completed the procedure. A bimanual massage was performed and confirmed good uterine tone.  All instruments were removed from the vagina.  Pressure was used to achieve hemostasis at the tenaculum site. The products of conception were sent for [facility] chromosome analysis and a small portion of the tissue sent for routine pathology. The patient tolerated the procedure well was brought to the recovery room in stable condition for the procedure. All sponge and needle counts correct x2.   [place], [facility]

## 2021-02-08 NOTE — Anesthesia Procedure Notes (Signed)
Procedure Name: LMA Insertion Date/Time: 02/08/2021 11:07 AM Performed by: Marny Lowenstein, CRNA Pre-anesthesia Checklist: Patient identified, Emergency Drugs available, Suction available and Patient being monitored Patient Re-evaluated:Patient Re-evaluated prior to induction Oxygen Delivery Method: Circle system utilized Preoxygenation: Pre-oxygenation with 100% oxygen Induction Type: IV induction Ventilation: Mask ventilation without difficulty LMA: LMA inserted LMA Size: 4.0 Number of attempts: 1 Placement Confirmation: positive ETCO2 and breath sounds checked- equal and bilateral Tube secured with: Tape Dental Injury: Teeth and Oropharynx as per pre-operative assessment  Comments: Nose ring in place; tape applied

## 2021-02-08 NOTE — Transfer of Care (Signed)
Immediate Anesthesia Transfer of Care Note  Patient: Samantha Jimenez  Procedure(s) Performed: DILATATION AND CURETTAGE with evacuation (Vagina ) CHROMOSOME STUDIES (Vagina )  Patient Location: PACU  Anesthesia Type:General  Level of Consciousness: drowsy and patient cooperative  Airway & Oxygen Therapy: Patient Spontanous Breathing  Post-op Assessment: Report given to RN and Post -op Vital signs reviewed and stable  Post vital signs: Reviewed and stable  Last Vitals:  Vitals Value Taken Time  BP 108/71 02/08/21 1137  Temp    Pulse 83 02/08/21 1139  Resp 19 02/08/21 1139  SpO2 97 % 02/08/21 1139  Vitals shown include unvalidated device data.  Last Pain:  Vitals:   02/08/21 0947  TempSrc:   PainSc: 0-No pain      Patients Stated Pain Goal: 5 (02/08/21 0947)  Complications: No notable events documented.

## 2021-02-08 NOTE — Anesthesia Preprocedure Evaluation (Signed)
Anesthesia Evaluation  Patient identified by MRN, date of birth, ID band Patient awake    Reviewed: Allergy & Precautions, H&P , NPO status , Patient's Chart, lab work & pertinent test results  Airway Mallampati: II  TM Distance: >3 FB Neck ROM: Full    Dental no notable dental hx.    Pulmonary neg pulmonary ROS, former smoker,    Pulmonary exam normal breath sounds clear to auscultation       Cardiovascular negative cardio ROS Normal cardiovascular exam Rhythm:Regular Rate:Normal     Neuro/Psych negative neurological ROS  negative psych ROS   GI/Hepatic negative GI ROS, Neg liver ROS,   Endo/Other  PCOS  Renal/GU negative Renal ROS  negative genitourinary   Musculoskeletal negative musculoskeletal ROS (+)   Abdominal   Peds negative pediatric ROS (+)  Hematology negative hematology ROS (+)   Anesthesia Other Findings   Reproductive/Obstetrics negative OB ROS                             Anesthesia Physical Anesthesia Plan  ASA: 2  Anesthesia Plan: General   Post-op Pain Management:    Induction: Intravenous  PONV Risk Score and Plan: 3 and Ondansetron, Dexamethasone, Treatment may vary due to age or medical condition and Midazolam  Airway Management Planned: LMA  Additional Equipment:   Intra-op Plan:   Post-operative Plan: Extubation in OR  Informed Consent: I have reviewed the patients History and Physical, chart, labs and discussed the procedure including the risks, benefits and alternatives for the proposed anesthesia with the patient or authorized representative who has indicated his/her understanding and acceptance.     Dental advisory given  Plan Discussed with: CRNA and Surgeon  Anesthesia Plan Comments:         Anesthesia Quick Evaluation

## 2021-02-11 ENCOUNTER — Encounter (HOSPITAL_BASED_OUTPATIENT_CLINIC_OR_DEPARTMENT_OTHER): Payer: Self-pay | Admitting: Obstetrics

## 2021-02-11 LAB — SURGICAL PATHOLOGY

## 2021-02-13 ENCOUNTER — Encounter (HOSPITAL_COMMUNITY): Payer: Self-pay | Admitting: Obstetrics and Gynecology

## 2021-02-13 ENCOUNTER — Other Ambulatory Visit: Payer: Self-pay

## 2021-02-13 ENCOUNTER — Inpatient Hospital Stay (HOSPITAL_COMMUNITY)
Admission: AD | Admit: 2021-02-13 | Discharge: 2021-02-13 | Disposition: A | Discharge status: Home / Self Care | Payer: Medicaid Other | Attending: Obstetrics and Gynecology | Admitting: Obstetrics and Gynecology

## 2021-02-13 ENCOUNTER — Inpatient Hospital Stay (HOSPITAL_COMMUNITY): Payer: Medicaid Other

## 2021-02-13 DIAGNOSIS — O035 Genital tract and pelvic infection following complete or unspecified spontaneous abortion: Secondary | ICD-10-CM

## 2021-02-13 DIAGNOSIS — R102 Pelvic and perineal pain: Secondary | ICD-10-CM

## 2021-02-13 LAB — WET PREP, GENITAL
Clue Cells Wet Prep HPF POC: NONE SEEN
Sperm: NONE SEEN
Trich, Wet Prep: NONE SEEN
WBC, Wet Prep HPF POC: >=10 — AB (ref <10)
Yeast Wet Prep HPF POC: NONE SEEN

## 2021-02-13 LAB — CBC
HCT: 38.6 % (ref 36.0–46.0)
Hemoglobin: 13.5 g/dL (ref 12.0–15.0)
MCH: 31.2 pg (ref 26.0–34.0)
MCHC: 35 g/dL (ref 30.0–36.0)
MCV: 89.1 fL (ref 80.0–100.0)
Platelets: 277 10*3/uL (ref 150–400)
RBC: 4.33 MIL/uL (ref 3.87–5.11)
RDW: 12.5 % (ref 11.5–15.5)
WBC: 7.3 10*3/uL (ref 4.0–10.5)
nRBC: 0 % (ref 0.0–0.2)

## 2021-02-13 LAB — URINALYSIS, ROUTINE W REFLEX MICROSCOPIC
Bilirubin Urine: NEGATIVE
Glucose, UA: NEGATIVE mg/dL
Ketones, ur: 5 mg/dL — AB
Leukocytes,Ua: NEGATIVE
Nitrite: NEGATIVE
Protein, ur: NEGATIVE mg/dL
Specific Gravity, Urine: 1.021 (ref 1.005–1.030)
pH: 8 (ref 5.0–8.0)

## 2021-02-13 MED ORDER — FENTANYL CITRATE (PF) 100 MCG/2ML IJ SOLN
100.0000 ug | Freq: Once | INTRAMUSCULAR | Status: AC
  Administered 2021-02-13: 100 ug via INTRAMUSCULAR
  Filled 2021-02-13: qty 2

## 2021-02-13 MED ORDER — AMOXICILLIN-POT CLAVULANATE 875-125 MG PO TABS: 1.0000 | ORAL_TABLET | Freq: Two times a day (BID) | ORAL | 0 refills | Status: DC

## 2021-02-13 MED ORDER — KETOROLAC TROMETHAMINE 10 MG PO TABS
10.0000 mg | ORAL_TABLET | Freq: Four times a day (QID) | ORAL | 0 refills | Status: DC | PRN
Start: 2021-02-13 — End: 2021-08-18

## 2021-02-13 MED ORDER — FLUCONAZOLE 150 MG PO TABS: 150.0000 mg | ORAL_TABLET | Freq: Once | ORAL | 0 refills | Status: AC

## 2021-02-13 NOTE — MAU Provider Note (Signed)
History     CSN: [id number]  Arrival date and time: 02/13/21 8315   Event Date/Time   First Provider Initiated Contact with Patient 02/13/21 1040      Chief Complaint  Patient presents with   Abdominal Pain   30 y.o. G6P0060 s/p D&E for MAB 1 week ago presenting for cramping and VB. Reports onset of cramping 5 days ago which was 2 days after the D&E. She has used Ibuprofen but it hasn't helped. She reports dark red VB that requires a pad but does not soak or saturate. Reports feeling feverish and chills during the night. Remote hx of STDs. No new partner. Denies vaginal discharge or malodor prior to procedure.   OB History     Gravida  6   Para      Term      Preterm      AB  6   Living  0      SAB  6   IAB      Ectopic      Multiple      Live Births              Past Medical History:  Diagnosis Date   Anemia    Anxiety    Clotting disorder (HCC)    per 02/07/21 phone call with pt, she was diagnosed with a clotting disoder at age 77, later on another doctor told her that her blood may clot to easily, she had had no treatment and has not had any problems   Heart palpitations    Pt states that she has never been diagnosed , but she does feel that her heart flutters at times. per pt 02/07/21   History of multiple miscarriages    PCOS (polycystic ovarian syndrome)    Recurrent UTI     Past Surgical History:  Procedure Laterality Date   DILATION AND CURETTAGE OF UTERUS  02/2020   DILATION AND CURETTAGE OF UTERUS N/A 02/08/2021   Procedure: DILATATION AND CURETTAGE with evacuation;  Surgeon: Marlow Baars, MD;  Location: [facility] ;  Service: Gynecology;  Laterality: N/A;    Family History  Problem Relation Age of Onset   Hypertension Mother    Hypertension Maternal Grandmother     Social History   Tobacco Use   Smoking status: Former    Types: Cigarettes    Quit date: 05/11/2016    Years since quitting: 4.7   Smokeless  tobacco: Never   Tobacco comments:    1 pack per week  Vaping Use   Vaping Use: Former   Substances: Nicotine, Flavoring  Substance Use Topics   Alcohol use: Yes    Alcohol/week: 4.0 standard drinks    Types: 4 Glasses of wine per week    Comment: occasionally   Drug use: Never    Comment: Pt denies former marijuana use. 02/07/2021    Allergies:  Allergies  Allergen Reactions   Oxycodone-Acetaminophen Itching and Nausea And Vomiting    Medications Prior to Admission  Medication Sig Dispense Refill Last Dose   Ascorbic Acid (VITAMIN C) 100 MG tablet Take 100 mg by mouth daily. Pt unsure of dosage.   Past Week   ferrous sulfate 325 (65 FE) MG tablet Take 325 mg by mouth daily with breakfast. Pt unsure of dosage.   Past Week   folic acid (FOLVITE) 1 MG tablet Take 1 mg by mouth daily. Pt unsure of dosage.   Past Week   omega-3 fish  oil (MAXEPA) 1000 MG CAPS capsule Take by mouth. Pt unsure of dosage.   Past Week   vitamin B-12 (CYANOCOBALAMIN) 500 MCG tablet Take 500 mcg by mouth daily. Pt unsure of dosage.   Past Week   zinc gluconate 50 MG tablet Take 50 mg by mouth daily. Pt unsure of dosage.   Past Week    Review of Systems  Constitutional:  Positive for chills and fever.  Gastrointestinal:  Positive for abdominal pain.  Genitourinary:  Positive for vaginal bleeding. Negative for dysuria, frequency and urgency.  Physical Exam   Blood pressure 118/78, pulse 73, temperature 98.8 F (37.1 C), resp. rate 18, unknown if currently breastfeeding.  Physical Exam Vitals and nursing note reviewed. Exam conducted with a chaperone present.  Constitutional:      General: She is not in acute distress.    Appearance: Normal appearance.  HENT:     Head: Normocephalic and atraumatic.  Cardiovascular:     Rate and Rhythm: Normal rate.  Pulmonary:     Effort: Pulmonary effort is normal. No respiratory distress.  Abdominal:     General: There is no distension.     Palpations:  Abdomen is soft. There is no mass.     Tenderness: There is no abdominal tenderness. There is no guarding or rebound.     Hernia: No hernia is present.  Genitourinary:    Comments: External: no lesions or erythema Vagina: rugated, pink, moist, scant bloody discharge, cleared with 1 fox swab Uterus: non enlarged, anteverted, ++ tender, no CMT Adnexae: no masses, no tenderness left, + tenderness right Cervix closed   Musculoskeletal:        General: Normal range of motion.     Cervical back: Normal range of motion.  Skin:    General: Skin is warm and dry.  Neurological:     General: No focal deficit present.     Mental Status: She is alert and oriented to person, place, and time.  Psychiatric:        Mood and Affect: Mood normal.        Behavior: Behavior normal.   Results for orders placed or performed during the hospital encounter of 02/13/21 (from the past 24 hour(s))  CBC     Status: None   Collection Time: 02/13/21 10:25 AM  Result Value Ref Range   WBC 7.3 4.0 - 10.5 K/uL   RBC 4.33 3.87 - 5.11 MIL/uL   Hemoglobin 13.5 12.0 - 15.0 g/dL   HCT 18.2 99.3 - 71.6 %   MCV 89.1 80.0 - 100.0 fL   MCH 31.2 26.0 - 34.0 pg   MCHC 35.0 30.0 - 36.0 g/dL   RDW 96.7 89.3 - 81.0 %   Platelets 277 150 - 400 K/uL   nRBC 0.0 0.0 - 0.2 %  Wet prep, genital     Status: Abnormal   Collection Time: 02/13/21 11:21 AM   Specimen: PATH Cytology Cervicovaginal Ancillary Only  Result Value Ref Range   Yeast Wet Prep HPF POC NONE SEEN NONE SEEN   Trich, Wet Prep NONE SEEN NONE SEEN   Clue Cells Wet Prep HPF POC NONE SEEN NONE SEEN   WBC, Wet Prep HPF POC >=10 (A) <10   Sperm NONE SEEN    US PELVIS LIMITED (TRANSABDOMINAL ONLY)  Result Date: 02/13/2021 CLINICAL DATA:  Status post dilation and curettage of 8 week fetus, pain EXAM: TRANSABDOMINAL ULTRASOUND OF PELVIS TECHNIQUE: Transabdominal ultrasound examination of the pelvis was performed including evaluation  of the uterus, ovaries, adnexal  regions, and pelvic cul-de-sac. COMPARISON:  Obstetric ultrasound 02/06/2021 FINDINGS: Uterus Measurements: 8.2 cm x 4.3 cm x 4.6 cm = volume: 91 mL. No fibroids or other mass visualized. Endometrium Thickness: 13 mm. The endometrium is thickened and heterogeneous in appearance. There is no increased vascularity. Right ovary Measurements: 3.2 cm x 2.3 cm x 2.5 cm = volume: 9.8 mL. Normal appearance/no adnexal mass. Left ovary Measurements: 3.4 cm x 2.2 cm x 2.7 cm = volume: 10.5 mL. Normal appearance/no adnexal mass. Other findings:  No abnormal free fluid. IMPRESSION: Thickened and heterogeneous appearing endometrium may reflect complex blood products. No increased vascularity is seen, but retained products of conception cannot be entirely excluded. Electronically Signed   By: Lesia Hausen M.D.   On: 02/13/2021 12:46    MAU Course  Procedures Fentanyl  MDM Labs ordered and reviewed. No signs of retained POCs. Will treat for presumed endometritis. Rx Augmentin. Stable for discharge home.   Assessment and Plan   1. Endometritis following abortive pregnancy   2. Pelvic pain    Discharge home Follow up at [facility] next week Return precautions Rx Augmentin Rx Diflucan Rx Toradol  Allergies as of 02/13/2021       Reactions   Oxycodone-acetaminophen Itching, Nausea And Vomiting        Medication List     STOP taking these medications    ferrous sulfate 325 (65 FE) MG tablet       TAKE these medications    amoxicillin-clavulanate 875-125 MG tablet Commonly known as: AUGMENTIN Take 1 tablet by mouth every 12 (twelve) hours.   fluconazole 150 MG tablet Commonly known as: DIFLUCAN Take 1 tablet (150 mg total) by mouth once for 1 dose.   folic acid 1 MG tablet Commonly known as: FOLVITE Take 1 mg by mouth daily. Pt unsure of dosage.   ketorolac 10 MG tablet Commonly known as: TORADOL Take 1 tablet (10 mg total) by mouth every 6 (six) hours as needed for moderate  pain.   omega-3 fish oil 1000 MG Caps capsule Commonly known as: MAXEPA Take by mouth. Pt unsure of dosage.   vitamin B-12 500 MCG tablet Commonly known as: CYANOCOBALAMIN Take 500 mcg by mouth daily. Pt unsure of dosage.   vitamin C 100 MG tablet Take 100 mg by mouth daily. Pt unsure of dosage.   zinc gluconate 50 MG tablet Take 50 mg by mouth daily. Pt unsure of dosage.       Donette Larry, CNM 02/13/2021, 1:15 PM

## 2021-02-13 NOTE — MAU Note (Signed)
Pt had D&C on Friday for missed AB. Stated she felt fine on Saturday. Sunday she stared cramping and cramping has gotten worse for the past 3 days. Passing clots and what looks like tissue as well. Having some n/v several times and had some diarrhea today. Taking tylenol for pain without relief.

## 2021-02-14 LAB — CULTURE, OB URINE

## 2021-02-15 LAB — GC/CHLAMYDIA PROBE AMP (~~LOC~~) NOT AT ARMC
Chlamydia: NEGATIVE
Comment: NEGATIVE
Comment: NORMAL
Neisseria Gonorrhea: NEGATIVE

## 2021-02-18 NOTE — Anesthesia Postprocedure Evaluation (Signed)
Anesthesia Post Note  Patient: Samantha Jimenez  Procedure(s) Performed: DILATATION AND CURETTAGE with evacuation (Vagina ) CHROMOSOME STUDIES (Vagina )     Patient location during evaluation: PACU Anesthesia Type: General Level of consciousness: awake and alert Pain management: pain level controlled Vital Signs Assessment: post-procedure vital signs reviewed and stable Respiratory status: spontaneous breathing, nonlabored ventilation, respiratory function stable and patient connected to nasal cannula oxygen Cardiovascular status: blood pressure returned to baseline and stable Postop Assessment: no apparent nausea or vomiting Anesthetic complications: no   No notable events documented.  Last Vitals:  Vitals:   02/08/21 1230 02/08/21 1305  BP: 100/72 111/74  Pulse: 63 65  Resp: 16 15  Temp:  36.8 C  SpO2: 98% 100%    Last Pain:  Vitals:   02/08/21 1305  TempSrc:   PainSc: 0-No pain                 Charmaine Placido S

## 2021-03-24 NOTE — L&D Delivery Note (Addendum)
      Delivery Note:   G7P0060 at [redacted]w[redacted]d  Admitting diagnosis: Encounter for induction of labor [Z34.90] Risks: FVL with IOL for delivery through heparin window.  Poor obstetric history with recurrent 1st trimester losses. Increased risk of PPH r/t potentially scarred uterus and concern for retained placenta.   First Stage:  Induction of labor:cytotec and AROM  Onset of labor: 1000 ROM: moderate amount of clear fluid 12/13 @ 2016 Active labor onset: 2100 Hydrotherapy: tub time 15 minutes Analgesia /Anesthesia/Pain control intrapartum: fentanyl x2    FHR tracing showed prolonged 12 minute deceleration to 70s with intermittent attempts to return to baseline. SVE performed and found to be fully effaced and with only an anterior lip remaining. Patient instructed to push and lip was manually reduced. Fetal heart rate tracing thereafter remained reassuring.  Second Stage:  Complete dilation at   2300 Onset of pushing at 2300 FHR second stage cat II  Pushing in sidelying and side-tilt lithotomy position with SNM, CNM and L&D staff support, mother, Cicily,  present for birth and supportive. Nuchal Cord: Yes  Delivery of a Live born female  Birth Weight:  pending  APGAR: 8 , 9  Newborn Delivery   Birth date/time: 03/[birth year]/2022 23:43:45 Delivery type: Vaginal, Spontaneous      in cephalic presentation, position OA to LOA. Shoulders and body delivered easily in somersault fashion.  Newborn with lusty cry, good tone, dried and stimulated, placed on maternal abdomen.  Cord double clamped after cessation of pulsation, cut by Cicily.  Collection of cord blood for typing completed.  Third Stage:  Placenta delivered 03/06/2022 at 0001-Spontaneous  with 3 vessel cord . Uterine tone boggy with clots manually removed, bleeding brisk with decrease in flow with fundal massage and clot removal. LUS swept x2 with clots removed. Uterus firm after, prophylactic cytotec administered.   Uterotonics: pitocin and cytotec buccal and 400 mcg PR.  Placenta to patient.  2nd degree  laceration identified.  Episiotomy:None  Local analgesia: Lidocaine   Repair: 2nd degree laceration. Repaired in usual fashion with 2.0 vicryl rapide with good approximation and excellent hemostasis noted.  Est. Blood Loss (mL):567.00   Complications: Rapid progress through transition with prolonged 12 minute FHR deceleration, resolved with position changes.    Mom to postpartum.  Baby Yasiin to Couplet care / Skin to Skin.  Delivery Report:  Review the Delivery Report for details.     Signed: Lamont Snowball, SNM 03/06/2022, 12:30 AM

## 2021-08-18 ENCOUNTER — Inpatient Hospital Stay (HOSPITAL_COMMUNITY)
Admission: AD | Admit: 2021-08-18 | Discharge: 2021-08-18 | Disposition: A | Discharge status: Home / Self Care | Payer: 59 | Attending: Obstetrics and Gynecology | Admitting: Obstetrics and Gynecology

## 2021-08-18 ENCOUNTER — Encounter (HOSPITAL_COMMUNITY): Payer: Self-pay

## 2021-08-18 ENCOUNTER — Other Ambulatory Visit: Payer: Self-pay

## 2021-08-18 ENCOUNTER — Inpatient Hospital Stay (HOSPITAL_COMMUNITY): Payer: 59

## 2021-08-18 DIAGNOSIS — K5900 Constipation, unspecified: Secondary | ICD-10-CM | POA: Insufficient documentation

## 2021-08-18 DIAGNOSIS — O219 Vomiting of pregnancy, unspecified: Secondary | ICD-10-CM | POA: Insufficient documentation

## 2021-08-18 DIAGNOSIS — O26891 Other specified pregnancy related conditions, first trimester: Secondary | ICD-10-CM | POA: Insufficient documentation

## 2021-08-18 DIAGNOSIS — Z3A12 12 weeks gestation of pregnancy: Secondary | ICD-10-CM | POA: Diagnosis not present

## 2021-08-18 DIAGNOSIS — R300 Dysuria: Secondary | ICD-10-CM | POA: Diagnosis not present

## 2021-08-18 DIAGNOSIS — M3119 Other thrombotic microangiopathy: Secondary | ICD-10-CM | POA: Diagnosis not present

## 2021-08-18 DIAGNOSIS — Z3490 Encounter for supervision of normal pregnancy, unspecified, unspecified trimester: Secondary | ICD-10-CM

## 2021-08-18 DIAGNOSIS — O26899 Other specified pregnancy related conditions, unspecified trimester: Secondary | ICD-10-CM

## 2021-08-18 DIAGNOSIS — R109 Unspecified abdominal pain: Secondary | ICD-10-CM | POA: Insufficient documentation

## 2021-08-18 DIAGNOSIS — O2311 Infections of bladder in pregnancy, first trimester: Secondary | ICD-10-CM | POA: Insufficient documentation

## 2021-08-18 DIAGNOSIS — N3090 Cystitis, unspecified without hematuria: Secondary | ICD-10-CM

## 2021-08-18 DIAGNOSIS — O99611 Diseases of the digestive system complicating pregnancy, first trimester: Secondary | ICD-10-CM | POA: Insufficient documentation

## 2021-08-18 LAB — URINALYSIS, ROUTINE W REFLEX MICROSCOPIC
Bilirubin Urine: NEGATIVE
Glucose, UA: NEGATIVE mg/dL
Hgb urine dipstick: NEGATIVE
Ketones, ur: NEGATIVE mg/dL
Nitrite: NEGATIVE
Protein, ur: NEGATIVE mg/dL
Specific Gravity, Urine: 1.004 — ABNORMAL LOW (ref 1.005–1.030)
pH: 7 (ref 5.0–8.0)

## 2021-08-18 LAB — POCT PREGNANCY, URINE: Preg Test, Ur: POSITIVE — AB

## 2021-08-18 LAB — WET PREP, GENITAL
Sperm: NONE SEEN
Trich, Wet Prep: NONE SEEN
WBC, Wet Prep HPF POC: >=10 — AB (ref <10)
Yeast Wet Prep HPF POC: NONE SEEN

## 2021-08-18 MED ORDER — POLYETHYLENE GLYCOL 3350 17 G PO PACK: 17.0000 g | PACK | Freq: Every day | ORAL | 0 refills | Status: DC

## 2021-08-18 MED ORDER — CEFADROXIL 500 MG PO CAPS
500.0000 mg | ORAL_CAPSULE | Freq: Two times a day (BID) | ORAL | Status: DC
  Administered 2021-08-18: 500 mg via ORAL
  Filled 2021-08-18: qty 1

## 2021-08-18 MED ORDER — DOXYLAMINE-PYRIDOXINE 10-10 MG PO TBEC
1.0000 | DELAYED_RELEASE_TABLET | Freq: Two times a day (BID) | ORAL | 0 refills | Status: DC
Start: 2021-08-18 — End: 2022-03-07

## 2021-08-18 MED ORDER — CEFADROXIL 500 MG PO CAPS: 500.0000 mg | ORAL_CAPSULE | Freq: Two times a day (BID) | ORAL | 0 refills | Status: AC

## 2021-08-18 MED ORDER — POLYETHYLENE GLYCOL 3350 17 G PO PACK: 17.0000 g | PACK | Freq: Every day | ORAL | Status: DC

## 2021-08-18 MED ORDER — POLYETHYLENE GLYCOL 3350 17 G PO PACK
17.0000 g | PACK | Freq: Every day | ORAL | Status: DC
  Filled 2021-08-18: qty 1

## 2021-08-18 MED ORDER — ONDANSETRON 4 MG PO TBDP
4.0000 mg | ORAL_TABLET | Freq: Once | ORAL | Status: AC
  Administered 2021-08-18: 4 mg via ORAL
  Filled 2021-08-18: qty 1

## 2021-08-18 NOTE — MAU Provider Note (Signed)
Chief Complaint: Nausea, Emesis, Abdominal Pain, and Dizziness        SUBJECTIVE HPI: Samantha Jimenez is a 31 y.o. G7P0060 at [redacted]w[redacted]d by LMP (and 9 week [place] in office at [facility])  who presents to maternity admissions reporting abdominal pain and nausea.   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    Past Medical History:  Diagnosis Date   Anemia    Anxiety    Clotting disorder (HCC)    per 02/07/21 phone call with pt, she was diagnosed with a clotting disoder at age 89, later on another doctor told her that her blood may clot to easily, she had had no treatment and has not had any problems   Heart palpitations    Pt states that she has never been diagnosed , but she does feel that her heart flutters at times. per pt 02/07/21   History of multiple miscarriages    PCOS (polycystic ovarian syndrome)    Recurrent UTI    Past Surgical History:  Procedure Laterality Date   DILATION AND CURETTAGE OF UTERUS  02/2020    DILATION AND CURETTAGE OF UTERUS N/A 02/08/2021   Procedure: DILATATION AND CURETTAGE with evacuation;  Surgeon: Marlow Baars, MD;  Location: [facility];  Service: Gynecology;  Laterality: N/A;   Social History   Socioeconomic History   Marital status: Married    Spouse name: Not on file   Number of children: Not on file   Years of education: Not on file   Highest education level: Not on file  Occupational History   Not on file  Tobacco Use   Smoking status: Former    Types: Cigarettes    Quit date: 05/11/2016    Years since quitting: 5.2   Smokeless tobacco: Never   Tobacco comments:    1 pack per week  Vaping Use   Vaping Use: Former   Substances: Nicotine, Flavoring  Substance and Sexual Activity   Alcohol use: Yes    Alcohol/week: 4.0 standard drinks    Types: 4 Glasses of wine per week    Comment: occasionally   Drug use: Never    Comment: Pt denies former marijuana use. 02/07/2021   Sexual activity: Yes    Birth control/protection: None  Other Topics Concern   Not on file  Social History Narrative   Not on file   Social Determinants of Health   Financial Resource Strain: Not on file  Food Insecurity: Not on file  Transportation Needs:  Not on file  Physical Activity: Not on file  Stress: Not on file  Social Connections: Not on file  Intimate Partner Violence: Not on file   No current facility-administered medications on file prior to encounter.   Current Outpatient Medications on File Prior to Encounter  Medication Sig Dispense Refill   amoxicillin-clavulanate (AUGMENTIN) 875-125 MG tablet Take 1 tablet by mouth every 12 (twelve) hours. 20 tablet 0   Ascorbic Acid (VITAMIN C) 100 MG tablet Take 100 mg by mouth daily. Pt unsure of dosage.     folic acid (FOLVITE) 1 MG tablet Take 1 mg by mouth daily. Pt unsure of dosage.     ketorolac (TORADOL) 10 MG tablet Take 1 tablet (10 mg total) by mouth every 6 (six) hours as needed for moderate pain.  20 tablet 0   omega-3 fish oil (MAXEPA) 1000 MG CAPS capsule Take by mouth. Pt unsure of dosage.     vitamin B-12 (CYANOCOBALAMIN) 500 MCG tablet Take 500 mcg by mouth daily. Pt unsure of dosage.     zinc gluconate 50 MG tablet Take 50 mg by mouth daily. Pt unsure of dosage.     Allergies  Allergen Reactions   Oxycodone-Acetaminophen Itching and Nausea And Vomiting    I have reviewed patient's Past Medical Hx, Surgical Hx, Family Hx, Social Hx, medications and allergies.   ROS:  Review of Systems  Constitutional:  Negative for fever.  Gastrointestinal:  Positive for abdominal pain, constipation, nausea and vomiting. Negative for diarrhea.  Endocrine: Positive for polyuria.  Genitourinary:  Positive for dysuria and frequency. Negative for dyspareunia and hematuria.  Neurological:  Negative for light-headedness.  Review of Systems  Other systems negative   Physical Exam  Physical Exam Patient Vitals for the past 24 hrs:  BP Temp Temp src Pulse Resp SpO2 Height Weight  08/18/21 0857 123/78 98.8 F (37.1 C) Oral 95 16 98 % 5\' 4"  (1.626 m) 68.1 kg   Constitutional: Well-developed, well-nourished female in no acute distress.  Cardiovascular: normal rate Respiratory: normal effort GI: Abd soft, Suprapubic TTP MS: Extremities nontender, no edema, normal ROM Neurologic: Alert and oriented x 4.    FHT 160s  by doppler  LAB RESULTS Results for orders placed or performed during the hospital encounter of 08/18/21 (from the past 24 hour(s))  Pregnancy, urine POC     Status: Abnormal   Collection Time: 08/18/21  8:36 AM  Result Value Ref Range   Preg Test, Ur POSITIVE (A) NEGATIVE  Urinalysis, Routine w reflex microscopic Urine, Clean Catch     Status: Abnormal   Collection Time: 08/18/21  8:59 AM  Result Value Ref Range   Color, Urine STRAW (A) YELLOW   APPearance CLEAR CLEAR   Specific Gravity, Urine 1.004 (L) 1.005 - 1.030   pH 7.0 5.0 - 8.0   Glucose, UA NEGATIVE NEGATIVE  mg/dL   Hgb urine dipstick NEGATIVE NEGATIVE   Bilirubin Urine NEGATIVE NEGATIVE   Ketones, ur NEGATIVE NEGATIVE mg/dL   Protein, ur NEGATIVE NEGATIVE mg/dL   Nitrite NEGATIVE NEGATIVE   Leukocytes,Ua SMALL (A) NEGATIVE   RBC / HPF 0-5 0 - 5 RBC/hpf   WBC, UA 0-5 0 - 5 WBC/hpf   Bacteria, UA MANY (A) NONE SEEN   Squamous Epithelial / LPF 0-5 0 - 5  Wet prep, genital     Status: Abnormal   Collection Time: 08/18/21 10:07 AM  Result Value Ref Range   Yeast Wet Prep HPF POC NONE SEEN NONE  SEEN   Trich, Wet Prep NONE SEEN NONE SEEN   Clue Cells Wet Prep HPF POC PRESENT (A) NONE SEEN   WBC, Wet Prep HPF POC >=10 (A) <10   Sperm NONE SEEN    Wet prep: Clue cells     IMAGING Bedside US FHT 150s, CRL on BSUS 5.6 cm, 5.8 cm - GA [redacted]w[redacted]d consistent with dating on office 9 week Korea and LMP however unable to see office [place] in system.  Pt informed that the ultrasound is considered a limited OB ultrasound and is not intended to be a complete ultrasound exam.  Patient also informed that the ultrasound is not being completed with the intent of assessing for fetal or placental anomalies or any pelvic abnormalities.  Explained that the purpose of today's ultrasound is to assess for  viability.  Patient acknowledges the purpose of the exam and the limitations of the study.    Formal US FINDINGS: Intrauterine gestational sac: Single   Yolk sac:  Visualized   Embryo:  Visualized   Cardiac Activity: Visualized   Heart Rate: 169   Bpm   CRL:   59.1 mm   12 w 3 d                  [place] EDC: 02/27/2022   Subchorionic hemorrhage:  None visualized.   Maternal uterus/adnexae: Maternal ovaries unremarkable.   IMPRESSION: Single living intrauterine gestation at estimated 12 week 3 day gestational age.  MAU Management/MDM: Moderate I have reviewed the triage vital signs and the nursing notes.   Pertinent labs & imaging results that were available during my care of the patient were reviewed by me  and considered in my medical decision making (see chart for details). I have reviewed her medical records including past results, notes and treatments.   Ordered  US to confirm dating/IUP and viability. Labs sent and results managed as listed below.     ASSESSMENT/PLAN 1. Vomiting or nausea of pregnancy   2. [redacted] weeks gestation of pregnancy   3. Cystitis   4. Abdominal pain affecting pregnancy   5. Intrauterine pregnancy     Cystitis Abdominal cramping Patient with 1-2 days of lower abdominal cramping and dysuria. On exam TTP of suprapubic area. No vaginal bleeding. Viable IUP at [redacted] weeks gestation with appropriate heart tones confirmed during today's visit - 5 day course of Duricef sent to patient's pharmacy.  - OB urine culture sent  3. IUP at [redacted] weeks gestation - Confirmed viable pregnancy with confirmed GA. Previously US only at [facility] and not able to see records here so repeated BSUS as well as formal US here  - continue Follow up at [facility] office  4. BV (clue cells on wet prep)- previously treated (last dose yesterday)  5. Constipation - miralax sent to patient's pharmacy  6. Nausea and vomiting in pregnany Normal vitals, no signs of dehydration. Home promethazine not helpful. Zofran ODT given here and sent prescription for Diclegis   Pt stable at time of discharge. Encouraged to return here if she develops worsening of symptoms, increase in pain, fever, or other concerning symptoms.   Warner Mccreedy, MD, MPH OB Fellow, Faculty Practice

## 2021-08-18 NOTE — Discharge Instructions (Addendum)
You came to the MAU because you had lower abdominal cramping. We checked your urine and suspect you have a urinary tract infection and we sent antibiotics to your pharmacy. We also checked for a yeast infection since you recently had BV and took antibiotics which can sometimes trigger a yeast infection. Your swab showed some remaining evidence of BV (but since you were just treated we did not re-treat you).   We got heart rate on your baby with our doppler which was normal and we did a bedside ultrasound which showed a very active baby (which is good!) and a heart rate that was normal. We did a formal Ultrasound as well and it confirmed that your pregnancy looks normal at this time.  We gave you medications for your nausea as well.  Please seek medical care if you have vaginal bleeding or worsening abdominal cramping. Please also seek medical care if you are unable to keep food or drinks down for over 12 hours due to heavy nausea and vomiting.

## 2021-08-18 NOTE — MAU Note (Signed)
Pt reports to mau with c/o lower abd cramping that started yesterday and has gotten increasingly worse this morning.  Pt denies vag bleeding. C/O seeing blood streaked emesis this morning. States her nausea meds are not working, last took them around midnight. Reports she is getting care at [facility] and is 12 weeks by ultrasound in their office. Pt reports extensive hx of SAB and is concerned for another.

## 2021-08-19 LAB — CULTURE, OB URINE: Culture: <10000 — AB

## 2021-08-20 LAB — GC/CHLAMYDIA PROBE AMP (~~LOC~~) NOT AT ARMC
Chlamydia: NEGATIVE
Comment: NEGATIVE
Comment: NORMAL
Neisseria Gonorrhea: NEGATIVE

## 2021-09-06 ENCOUNTER — Encounter (HOSPITAL_COMMUNITY): Payer: Self-pay | Admitting: Obstetrics and Gynecology

## 2021-09-06 ENCOUNTER — Inpatient Hospital Stay (HOSPITAL_COMMUNITY)
Admission: AD | Admit: 2021-09-06 | Discharge: 2021-09-06 | Disposition: A | Discharge status: Home / Self Care | Payer: 59 | Attending: Obstetrics and Gynecology | Admitting: Obstetrics and Gynecology

## 2021-09-06 DIAGNOSIS — O26892 Other specified pregnancy related conditions, second trimester: Secondary | ICD-10-CM | POA: Insufficient documentation

## 2021-09-06 DIAGNOSIS — O2622 Pregnancy care for patient with recurrent pregnancy loss, second trimester: Secondary | ICD-10-CM | POA: Diagnosis not present

## 2021-09-06 DIAGNOSIS — O09292 Supervision of pregnancy with other poor reproductive or obstetric history, second trimester: Secondary | ICD-10-CM | POA: Diagnosis not present

## 2021-09-06 DIAGNOSIS — Z3A15 15 weeks gestation of pregnancy: Secondary | ICD-10-CM | POA: Insufficient documentation

## 2021-09-06 DIAGNOSIS — M545 Low back pain, unspecified: Secondary | ICD-10-CM | POA: Diagnosis present

## 2021-09-06 DIAGNOSIS — Z3492 Encounter for supervision of normal pregnancy, unspecified, second trimester: Secondary | ICD-10-CM

## 2021-09-06 DIAGNOSIS — M549 Dorsalgia, unspecified: Secondary | ICD-10-CM

## 2021-09-06 LAB — URINALYSIS, MICROSCOPIC (REFLEX)

## 2021-09-06 LAB — URINALYSIS, ROUTINE W REFLEX MICROSCOPIC
Bilirubin Urine: NEGATIVE
Glucose, UA: NEGATIVE mg/dL
Hgb urine dipstick: NEGATIVE
Ketones, ur: NEGATIVE mg/dL
Nitrite: NEGATIVE
Protein, ur: NEGATIVE mg/dL
Specific Gravity, Urine: 1.01 (ref 1.005–1.030)
pH: 6.5 (ref 5.0–8.0)

## 2021-09-06 MED ORDER — CYCLOBENZAPRINE HCL 5 MG PO TABS
10.0000 mg | ORAL_TABLET | Freq: Once | ORAL | Status: AC
  Administered 2021-09-06: 10 mg via ORAL
  Filled 2021-09-06: qty 2

## 2021-09-06 MED ORDER — CYCLOBENZAPRINE HCL 10 MG PO TABS: 10.0000 mg | ORAL_TABLET | Freq: Two times a day (BID) | ORAL | 0 refills | Status: DC | PRN

## 2021-09-06 NOTE — MAU Note (Addendum)
Samantha Jimenez is a 31 y.o. at [redacted]w[redacted]d here in MAU reporting: having consistent (constant) lower back pain.  No decrease in FH, pt is  unable to hear FH with personal doppler that she bought, is kind of freaking her out. Has a hx of multiple miscarriages, so is kind of freaking her out. Denies bleeding or abd pain.  Onset of complaint: a wk Pain score: 8 Vitals:   09/06/21 0859  BP: 118/79  Pulse: 89  Resp: 18  Temp: 98.9 F (37.2 C)  SpO2: 100%     FHT:158 Lab orders placed from triage:  urine

## 2021-09-06 NOTE — Discharge Instructions (Signed)
Please exercise precaution when using an at-home doppler. Since you have not received any formal training on how to use the doppler or how to find fetal heart tones at various gestational ages, it can increase the anxiety that you are having about your pregnancy. Please call your OB office, if you have any further questions, problems or concerns.

## 2021-09-06 NOTE — MAU Provider Note (Addendum)
History     CSN: [id number]  Arrival date and time: 09/06/21 0843   Event Date/Time   First Provider Initiated Contact with Patient 09/06/21 [phone]      Chief Complaint  Patient presents with   Back Pain   HPI [name] is a 31 y.o. year old G45P0060 female at [redacted]w[redacted]d weeks gestation who presents to MAU reporting consistent lower back pain x 2-3 weeks. She got concerned when she read on her Flow app that told her to "beware of constant lower back pain in pregnancy." She also reports she was unable to hear FHTs with her at-home doppler. She states, "with my history of having 7 miscarriages, it freaked me out not to be able to hear the baby's heartbeat." She denies VB or abdominal pain. She works night shift in a laboratory. She reports she has had "trouble staying asleep" during the day when that used to not be a problem. She receives [facility] with [facility]; next appt is 09/16/2021.   OB History     Gravida  7   Para      Term      Preterm      AB  6   Living  0      SAB  6   IAB      Ectopic      Multiple      Live Births              Past Medical History:  Diagnosis Date   Anemia    Anxiety    Clotting disorder (HCC)    per 02/07/21 phone call with pt, she was diagnosed with a clotting disoder at age 60, later on another doctor told her that her blood may clot to easily, she had had no treatment and has not had any problems   Heart palpitations    Pt states that she has never been diagnosed , but she does feel that her heart flutters at times. per pt 02/07/21   History of multiple miscarriages    PCOS (polycystic ovarian syndrome)    Recurrent UTI     Past Surgical History:  Procedure Laterality Date   DILATION AND CURETTAGE OF UTERUS  02/2020   DILATION AND CURETTAGE OF UTERUS N/A 02/08/2021   Procedure: DILATATION AND CURETTAGE with evacuation;  Surgeon: Marlow Baars, MD;  Location: [facility];  Service: Gynecology;   Laterality: N/A;    Family History  Problem Relation Age of Onset   Hypertension Mother    Hypertension Maternal Grandmother     Social History   Tobacco Use   Smoking status: Former    Types: Cigarettes    Quit date: 05/11/2016    Years since quitting: 5.3   Smokeless tobacco: Never   Tobacco comments:    1 pack per week  Vaping Use   Vaping Use: Former   Substances: Nicotine, Flavoring  Substance Use Topics   Alcohol use: Not Currently    Alcohol/week: 4.0 standard drinks of alcohol    Types: 4 Glasses of wine per week    Comment: occasionally   Drug use: Never    Comment: Pt denies former marijuana use. 02/07/2021    Allergies:  Allergies  Allergen Reactions   Oxycodone-Acetaminophen Itching and Nausea And Vomiting    No medications prior to admission.    Review of Systems  Constitutional: Negative.   HENT: Negative.    Eyes: Negative.   Respiratory: Negative.  Cardiovascular: Negative.   Gastrointestinal: Negative.   Endocrine: Negative.   Genitourinary:        Unable to hear FHT with at home doppler  "it has me freaking out"  Musculoskeletal:  Positive for back pain (lower x 2-3 wks).  Skin: Negative.   Allergic/Immunologic: Negative.   Neurological: Negative.   Hematological: Negative.   Psychiatric/Behavioral: Negative.     Physical Exam   Blood pressure 124/89, pulse 88, temperature 98.9 F (37.2 C), temperature source Oral, resp. rate 18, height 5\' 4"  (1.626 m), weight 70.9 kg, SpO2 100 %, unknown if currently breastfeeding.  Physical Exam Vitals and nursing note reviewed.  Constitutional:      Appearance: Normal appearance. She is obese.  Cardiovascular:     Rate and Rhythm: Normal rate.  Pulmonary:     Effort: Pulmonary effort is normal.  Genitourinary:    Comments: Not indicated Musculoskeletal:        General: Normal range of motion.     Cervical back: Normal range of motion.  Skin:    General: Skin is warm and dry.   Neurological:     Mental Status: She is alert and oriented to person, place, and time.  Psychiatric:        Mood and Affect: Mood normal.        Behavior: Behavior normal.        Thought Content: Thought content normal.        Judgment: Judgment normal.    FHTs by doppler: 158 bpm   Reassessment @ 1035: back pain has gone away. Feeling some pain in upper back in between shoulder blades. "I think its tension and from work."  MAU Course  Procedures  MDM CCUA Flexeril 10 mg po -- resolved pain Heat Pack to back   Results for orders placed or performed during the hospital encounter of 09/06/21 (from the past 24 hour(s))  Urinalysis, Routine w reflex microscopic Urine, Clean Catch     Status: Abnormal   Collection Time: 09/06/21  9:51 AM  Result Value Ref Range   Color, Urine YELLOW YELLOW   APPearance CLEAR CLEAR   Specific Gravity, Urine 1.010 1.005 - 1.030   pH 6.5 5.0 - 8.0   Glucose, UA NEGATIVE NEGATIVE mg/dL   Hgb urine dipstick NEGATIVE NEGATIVE   Bilirubin Urine NEGATIVE NEGATIVE   Ketones, ur NEGATIVE NEGATIVE mg/dL   Protein, ur NEGATIVE NEGATIVE mg/dL   Nitrite NEGATIVE NEGATIVE   Leukocytes,Ua MODERATE (A) NEGATIVE  Urinalysis, Microscopic (reflex)     Status: Abnormal   Collection Time: 09/06/21  9:51 AM  Result Value Ref Range   RBC / HPF 0-5 0 - 5 RBC/hpf   WBC, UA 6-10 0 - 5 WBC/hpf   Bacteria, UA MANY (A) NONE SEEN   Squamous Epithelial / LPF 0-5 0 - 5   Mucus PRESENT     Assessment and Plan  Back pain affecting pregnancy in second trimester  - Information provided on back pain in pregnancy - Rx for Flexeril 10 mg BID prn pain  Fetal heart tones present, second trimester - Reassurance given that fetal well-being is good - Advised to exercise precaution when using at home doppler since she has not been formally trained on how to find FHT at various gestational ages. Doing so could increase the anxiety she is having about her pregnancy  [redacted] weeks  gestation of pregnancy   - Discharge patient - Keep scheduled appt on 09/16/2021 - Patient verbalized an understanding of  the plan of care and agrees.    Raelyn Mora, CNM 09/06/2021, 9:38 AM

## 2021-09-11 ENCOUNTER — Inpatient Hospital Stay (HOSPITAL_COMMUNITY)
Admission: AD | Admit: 2021-09-11 | Discharge: 2021-09-11 | Disposition: A | Discharge status: Home / Self Care | Payer: 59 | Attending: Obstetrics and Gynecology | Admitting: Obstetrics and Gynecology

## 2021-09-11 ENCOUNTER — Other Ambulatory Visit: Payer: Self-pay

## 2021-09-11 ENCOUNTER — Encounter (HOSPITAL_COMMUNITY): Payer: Self-pay | Admitting: Obstetrics and Gynecology

## 2021-09-11 DIAGNOSIS — O99612 Diseases of the digestive system complicating pregnancy, second trimester: Secondary | ICD-10-CM | POA: Insufficient documentation

## 2021-09-11 DIAGNOSIS — R1030 Lower abdominal pain, unspecified: Secondary | ICD-10-CM | POA: Diagnosis not present

## 2021-09-11 DIAGNOSIS — R109 Unspecified abdominal pain: Secondary | ICD-10-CM

## 2021-09-11 DIAGNOSIS — O26852 Spotting complicating pregnancy, second trimester: Secondary | ICD-10-CM | POA: Diagnosis not present

## 2021-09-11 DIAGNOSIS — O26859 Spotting complicating pregnancy, unspecified trimester: Secondary | ICD-10-CM | POA: Diagnosis not present

## 2021-09-11 DIAGNOSIS — O99619 Diseases of the digestive system complicating pregnancy, unspecified trimester: Secondary | ICD-10-CM

## 2021-09-11 DIAGNOSIS — O26899 Other specified pregnancy related conditions, unspecified trimester: Secondary | ICD-10-CM

## 2021-09-11 DIAGNOSIS — O26892 Other specified pregnancy related conditions, second trimester: Secondary | ICD-10-CM | POA: Diagnosis not present

## 2021-09-11 DIAGNOSIS — K5900 Constipation, unspecified: Secondary | ICD-10-CM

## 2021-09-11 DIAGNOSIS — Z3A16 16 weeks gestation of pregnancy: Secondary | ICD-10-CM | POA: Diagnosis not present

## 2021-09-11 LAB — URINALYSIS, ROUTINE W REFLEX MICROSCOPIC
Bilirubin Urine: NEGATIVE
Glucose, UA: NEGATIVE mg/dL
Hgb urine dipstick: NEGATIVE
Ketones, ur: NEGATIVE mg/dL
Nitrite: NEGATIVE
Protein, ur: NEGATIVE mg/dL
Specific Gravity, Urine: 1.025 (ref 1.005–1.030)
pH: 5 (ref 5.0–8.0)

## 2021-09-11 LAB — WET PREP, GENITAL
Clue Cells Wet Prep HPF POC: NONE SEEN
Sperm: NONE SEEN
Trich, Wet Prep: NONE SEEN
WBC, Wet Prep HPF POC: <10 (ref <10)
Yeast Wet Prep HPF POC: NONE SEEN

## 2021-09-11 NOTE — MAU Provider Note (Signed)
History    [id number]  Arrival date and time: 09/11/21 3664   Chief Complaint  Patient presents with   Abdominal Pain   Vaginal Bleeding   HPI Samantha Jimenez is a 31 y.o. at [redacted]w[redacted]d who presents to MAU with cramping, vaginal bleeding, and rectal bleeding in the setting of constipation.   Patient reports that she has had 7 miscarriages previously. Her prior workup reviewed Factor V Leiden, for which she is on heparin at this time. This morning, she started having lower abdominal cramping followed by a small amount of brown spotting. These symptoms have since resolved. She also reports that she had a bowel movement this morning and noticed bright red blood after wiping. She thinks this is related to her constipation.  She reports she is taking MiraLAX for this daily, and it makes her stools soft.  She has stopped taking this however 3 days ago because she is still going every 3 to 4 days, which she notes is not normal for her.  Her mom, who is present via phone for this encounter, is concerned about her history and feels that her current symptoms are being exacerbated by her job.  She is wondering if she can be written out of work at this time.  She follows with [facility].  She has an upcoming appointment with them next Monday.  She denies any fevers, nausea, vomiting, or dysuria at this time.  She does report a history of intermittent burning when she urinates, but this is not constant.  She reports having a negative UTI work-up previously.  She has no other concerns at this time.  OB History     Gravida  7   Para      Term      Preterm      AB  6   Living  0      SAB  6   IAB      Ectopic      Multiple      Live Births              Past Medical History:  Diagnosis Date   Anemia    Anxiety    Clotting disorder (HCC)    per 02/07/21 phone call with pt, she was diagnosed with a clotting disoder at age 89, later on another doctor told her that her blood may clot  to easily, she had had no treatment and has not had any problems   Heart palpitations    Pt states that she has never been diagnosed , but she does feel that her heart flutters at times. per pt 02/07/21   History of multiple miscarriages    PCOS (polycystic ovarian syndrome)    Recurrent UTI     Past Surgical History:  Procedure Laterality Date   DILATION AND CURETTAGE OF UTERUS  02/2020   DILATION AND CURETTAGE OF UTERUS N/A 02/08/2021   Procedure: DILATATION AND CURETTAGE with evacuation;  Surgeon: Marlow Baars, MD;  Location: [facility];  Service: Gynecology;  Laterality: N/A;    Family History  Problem Relation Age of Onset   Hypertension Mother    Hypertension Maternal Grandmother     Social History   Socioeconomic History   Marital status: Married    Spouse name: Not on file   Number of children: Not on file   Years of education: Not on file   Highest education level: Not on file  Occupational History   Not on file  Tobacco Use   Smoking status: Former    Types: Cigarettes    Quit date: 05/11/2016    Years since quitting: 5.3   Smokeless tobacco: Never   Tobacco comments:    1 pack per week  Vaping Use   Vaping Use: Former   Substances: Nicotine, Flavoring  Substance and Sexual Activity   Alcohol use: Not Currently    Alcohol/week: 4.0 standard drinks of alcohol    Types: 4 Glasses of wine per week    Comment: occasionally   Drug use: Never    Comment: Pt denies former marijuana use. 02/07/2021   Sexual activity: Yes    Birth control/protection: None  Other Topics Concern   Not on file  Social History Narrative   Not on file   Social Determinants of Health   Financial Resource Strain: Not on file  Food Insecurity: Not on file  Transportation Needs: Not on file  Physical Activity: Not on file  Stress: Not on file  Social Connections: Not on file  Intimate Partner Violence: Not on file    Allergies  Allergen Reactions    Oxycodone-Acetaminophen Itching and Nausea And Vomiting    No current facility-administered medications on file prior to encounter.   Current Outpatient Medications on File Prior to Encounter  Medication Sig Dispense Refill   cyclobenzaprine (FLEXERIL) 10 MG tablet Take 1 tablet (10 mg total) by mouth 2 (two) times daily as needed for muscle spasms. 20 tablet 0   Doxylamine-Pyridoxine 10-10 MG TBEC Take 1 tablet by mouth in the morning and at bedtime. 60 tablet 0   polyethylene glycol (MIRALAX) 17 g packet Take 17 g by mouth daily. 14 each 0   Prenatal Vit-Fe Fumarate-FA (MULTIVITAMIN-PRENATAL) 27-0.8 MG TABS tablet Take 1 tablet by mouth daily at 12 noon.     folic acid (FOLVITE) 1 MG tablet Take 1 mg by mouth daily. Pt unsure of dosage.     vitamin B-12 (CYANOCOBALAMIN) 500 MCG tablet Take 500 mcg by mouth daily. Pt unsure of dosage.      ROS Pertinent positives and negative per HPI, all others reviewed and negative.  Physical Exam   BP 117/74   Pulse 84   Temp 98.4 F (36.9 C) (Oral)   Resp 16   Ht 5\' 4"  (1.626 m)   Wt 71.9 kg   LMP  (LMP Unknown)   SpO2 98% Comment: room air  BMI 27.21 kg/m   Chaperone present for exam.  Physical Exam Constitutional:      Appearance: Normal appearance. She is not toxic-appearing.  HENT:     Head: Normocephalic and atraumatic.     Mouth/Throat:     Mouth: Mucous membranes are moist.  Eyes:     Extraocular Movements: Extraocular movements intact.     Conjunctiva/sclera: Conjunctivae normal.  Cardiovascular:     Rate and Rhythm: Normal rate.  Pulmonary:     Effort: Pulmonary effort is normal.  Abdominal:     Palpations: Abdomen is soft.     Tenderness: There is no abdominal tenderness.  Genitourinary:    Comments: Normal-appearing external genitalia, white vaginal discharge present, no bleeding noted, cervix visually closed, no external hemorrhoids or fissures visualized. Musculoskeletal:        General: Normal range of motion.      Right lower leg: No edema.     Left lower leg: No edema.  Skin:    General: Skin is warm and dry.  Neurological:     General: No  focal deficit present.     Mental Status: She is alert and oriented to person, place, and time.  Psychiatric:        Mood and Affect: Mood normal.        Behavior: Behavior normal.    FHT 154 bpm via doppler   Labs Results for orders placed or performed during the hospital encounter of 09/11/21 (from the past 24 hour(s))  Urinalysis, Routine w reflex microscopic Urine, Clean Catch     Status: Abnormal   Collection Time: 09/11/21  7:50 AM  Result Value Ref Range   Color, Urine YELLOW YELLOW   APPearance HAZY (A) CLEAR   Specific Gravity, Urine 1.025 1.005 - 1.030   pH 5.0 5.0 - 8.0   Glucose, UA NEGATIVE NEGATIVE mg/dL   Hgb urine dipstick NEGATIVE NEGATIVE   Bilirubin Urine NEGATIVE NEGATIVE   Ketones, ur NEGATIVE NEGATIVE mg/dL   Protein, ur NEGATIVE NEGATIVE mg/dL   Nitrite NEGATIVE NEGATIVE   Leukocytes,Ua SMALL (A) NEGATIVE   RBC / HPF 0-5 0 - 5 RBC/hpf   WBC, UA 6-10 0 - 5 WBC/hpf   Bacteria, UA RARE (A) NONE SEEN   Squamous Epithelial / LPF 0-5 0 - 5   Mucus PRESENT   Wet prep, genital     Status: None   Collection Time: 09/11/21  8:47 AM  Result Value Ref Range   Yeast Wet Prep HPF POC NONE SEEN NONE SEEN   Trich, Wet Prep NONE SEEN NONE SEEN   Clue Cells Wet Prep HPF POC NONE SEEN NONE SEEN   WBC, Wet Prep HPF POC <10 <10   Sperm NONE SEEN    Imaging No results found.  MAU Course  Procedures Lab Orders         Wet prep, genital         Culture, OB Urine         Urinalysis, Routine w reflex microscopic Urine, Clean Catch    No orders of the defined types were placed in this encounter.  Imaging Orders  No imaging studies ordered today   MDM Upon arrival, VSS, no acute distress Exam largely unremarkable, normal FHT at 154 bpm via doppler  No bleeding noted on exam, cervix closed  UA with small leukocytes and rare  bacteria - will send for culture Wet prep negative GC/CT pending  Assessment and Plan   1. Spotting in pregnancy 2. Abdominal cramping affecting pregnancy -Pelvic exam unremarkable, cervix visually closed -No active bleeding, normal FHT via Doppler -Wet prep negative, GC/CT pending -UA unremarkable; sent for culture due to small leukocytes  -Asymptomatic while in MAU -Discussed etiologies of spotting and cramping in pregnancy -Stable for discharge with return precautions -Advised to call her OB/GYN for follow-up to discuss concerns regarding FMLA  3. Constipation during pregnancy, antepartum -No external hemorrhoids or fissures on exam -Currently having a BM every 3 to 4 days -Discussed that some of her cramping may be due to constipation and gas pain -Stools softer with MiraLAX, but has since discontinued this.  Her stools are now hard again. -Recommended increasing MiraLAX to twice daily until she has regular bowel movements -Can then transition to once daily dosing for maintenance -Encouraged to increase fluid intake and fiber in her diet -Will reassess outpatient -All questions and concerns addressed  Worthy Rancher, MD

## 2021-09-11 NOTE — MAU Note (Signed)
Samantha Jimenez is a 31 y.o. at [redacted]w[redacted]d here in MAU reporting: spotting and cramping since around 2. Also noted some anal bleeding, unsure if she had hemorrhoids.  Onset of complaint: today  Pain score: 9/10  Vitals:   09/11/21 0737  BP: 125/79  Pulse: 93  Resp: 16  Temp: 99.2 F (37.3 C)  SpO2: 98%     FHT:154  Lab orders placed from triage: UA

## 2021-09-12 ENCOUNTER — Other Ambulatory Visit: Payer: Self-pay | Admitting: Family Medicine

## 2021-09-12 DIAGNOSIS — O2342 Unspecified infection of urinary tract in pregnancy, second trimester: Secondary | ICD-10-CM

## 2021-09-12 LAB — GC/CHLAMYDIA PROBE AMP (~~LOC~~) NOT AT ARMC
Chlamydia: NEGATIVE
Comment: NEGATIVE
Comment: NORMAL
Neisseria Gonorrhea: NEGATIVE

## 2021-09-12 MED ORDER — CEFADROXIL 500 MG PO CAPS: 500.0000 mg | ORAL_CAPSULE | Freq: Two times a day (BID) | ORAL | 0 refills | Status: AC

## 2021-09-13 LAB — CULTURE, OB URINE: Culture: 80000 — AB

## 2021-09-14 ENCOUNTER — Other Ambulatory Visit: Payer: Self-pay | Admitting: Advanced Practice Midwife

## 2021-11-26 ENCOUNTER — Ambulatory Visit: Payer: 59 | Attending: Internal Medicine

## 2021-11-26 ENCOUNTER — Other Ambulatory Visit (HOSPITAL_BASED_OUTPATIENT_CLINIC_OR_DEPARTMENT_OTHER): Payer: Self-pay

## 2021-11-26 DIAGNOSIS — Z23 Encounter for immunization: Secondary | ICD-10-CM

## 2021-11-26 MED ORDER — NOVAVAX COVID-19 VACCINE 5 MCG/0.5ML IM SUSP
INTRAMUSCULAR | 0 refills | Status: DC
Start: 2021-11-26 — End: 2022-01-04
  Filled 2021-11-26: qty 0.5, 1d supply, fill #0

## 2021-11-27 ENCOUNTER — Other Ambulatory Visit (HOSPITAL_BASED_OUTPATIENT_CLINIC_OR_DEPARTMENT_OTHER): Payer: Self-pay

## 2021-12-27 IMAGING — US US OB < 14 WEEKS - US OB TV
1 series · 15 of 28 positions shown · non-contrast
Comparison: None.

CLINICAL DATA: Pelvic and low back pain and spotting in the first
trimester.

EXAM:
OBSTETRIC <14 WK US AND TRANSVAGINAL OB
US DOPPLER ULTRASOUND OF OVARIES
TECHNIQUE: Both transabdominal and transvaginal ultrasound examinations were
performed for complete evaluation of the gestation as well as the
maternal uterus, adnexal regions, and pelvic cul-de-sac.
Transvaginal technique was performed to assess early pregnancy.
Color Doppler ultrasound was utilized to evaluate blood flow to the
ovaries.

[Series 1: us ob < 14 weeks - us ob tv · 55 acquisitions, 15 frames shown]
[im 1/55]
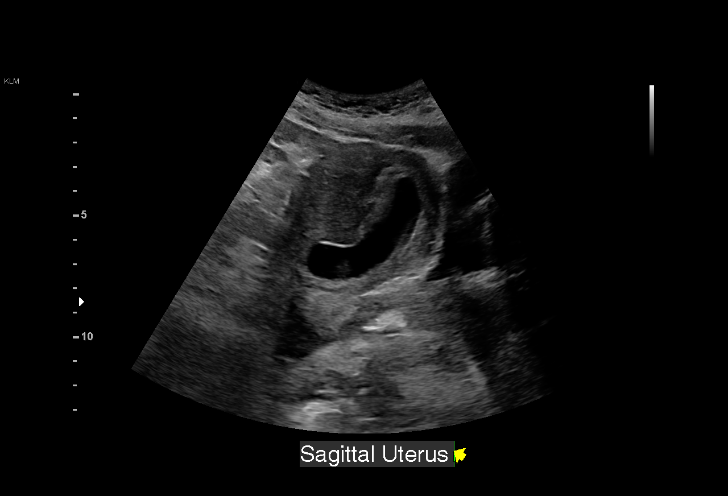
[im 5/55]
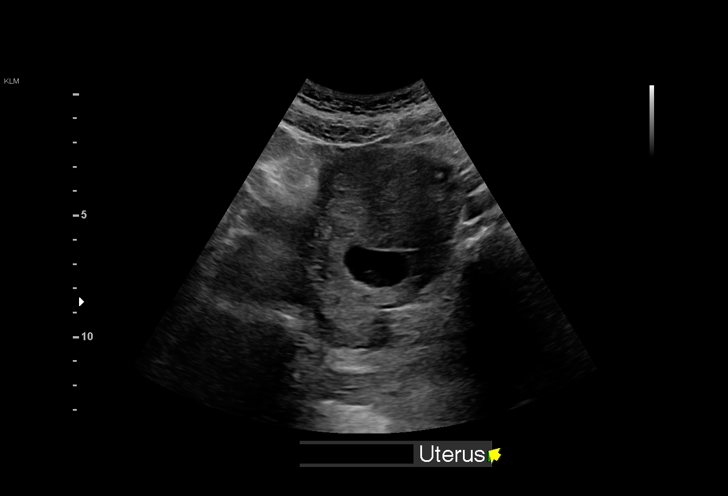
[im 9/55]
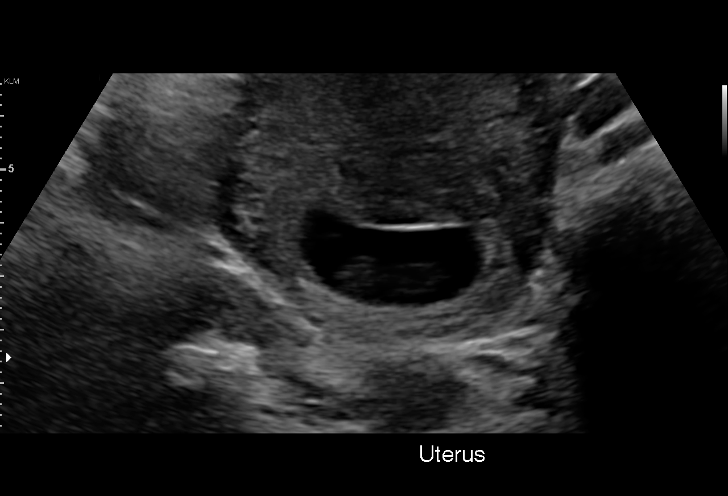
[im 13/55]
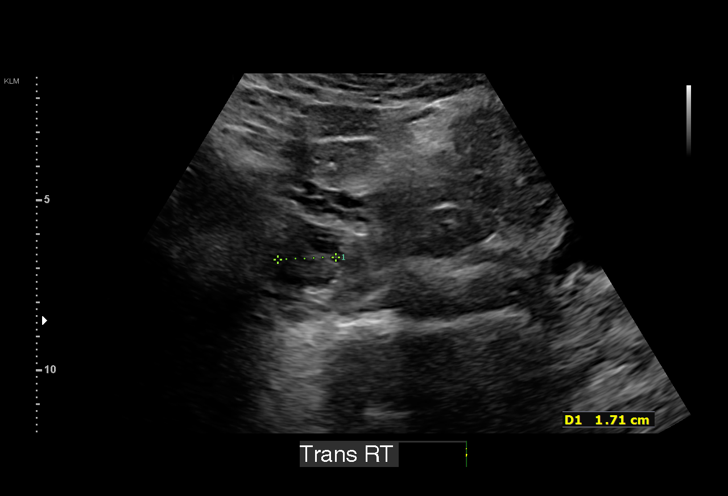
[im 17/55]
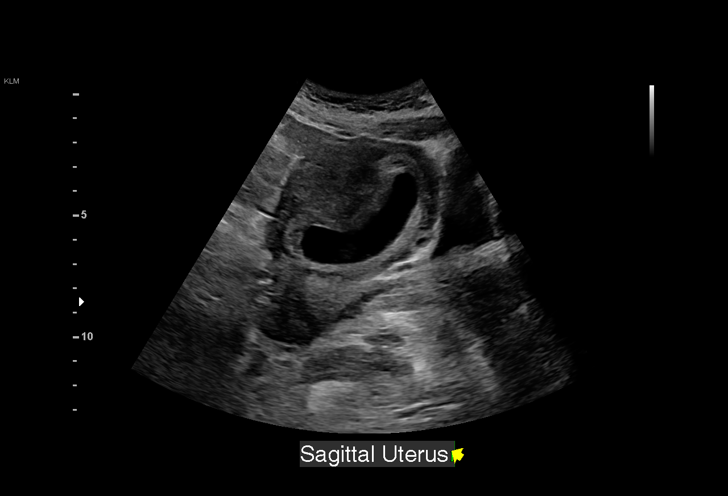
[im 21/55]
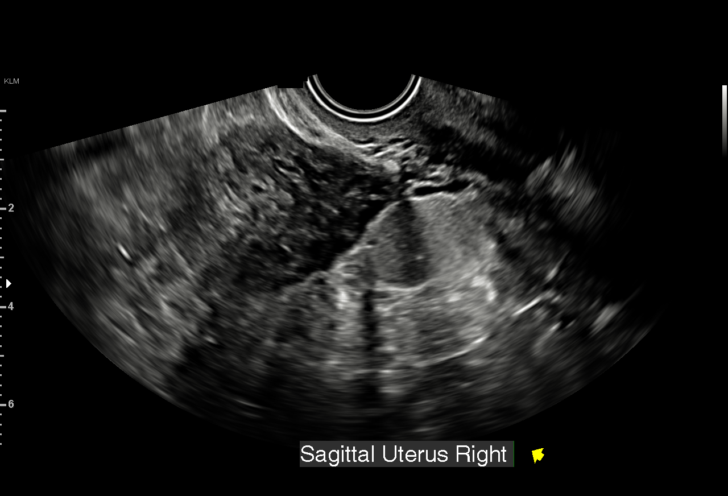
[im 25/55]
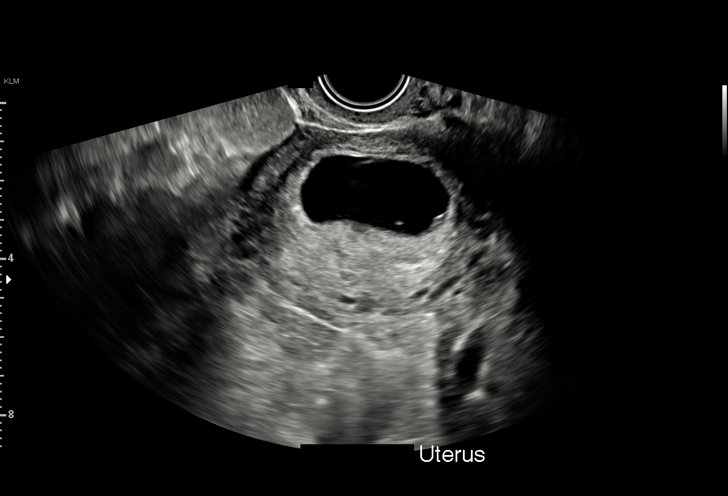
[im 29/55]
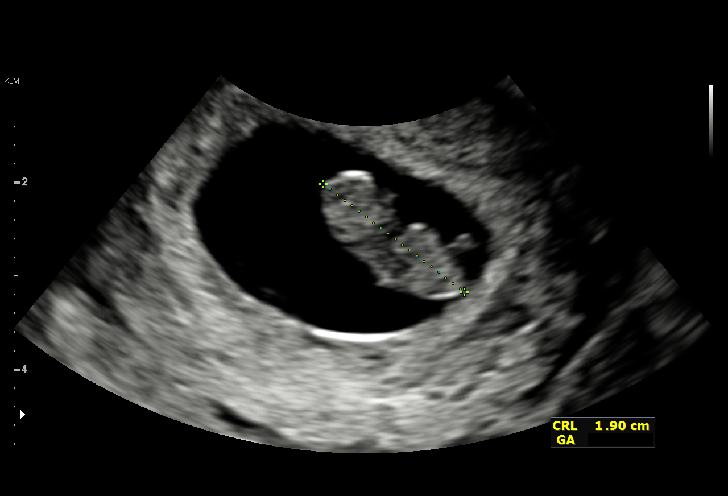
[im 31/55]
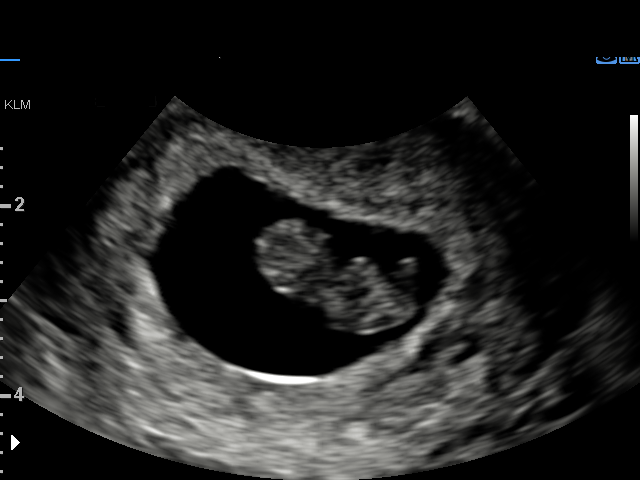
[im 35/55]
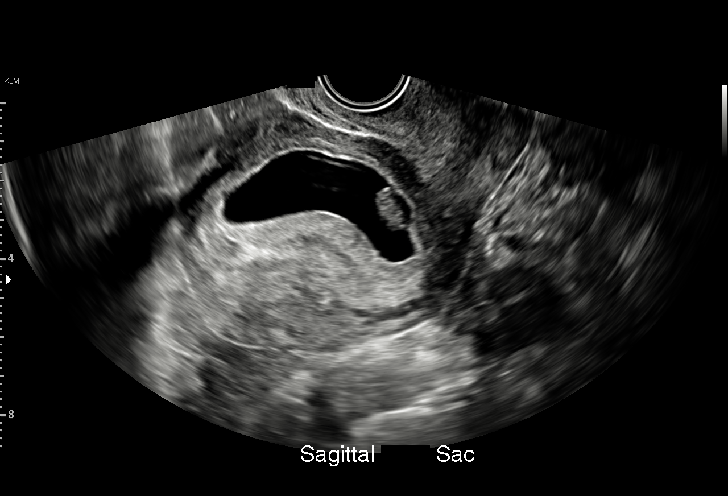
[im 39/55]
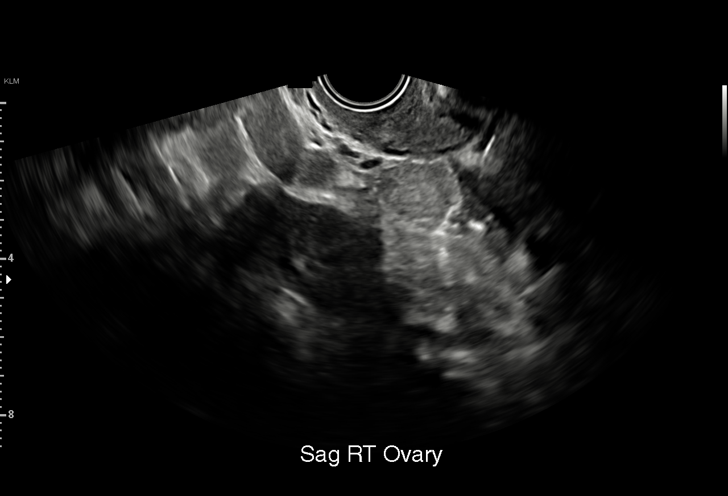
[im 43/55]
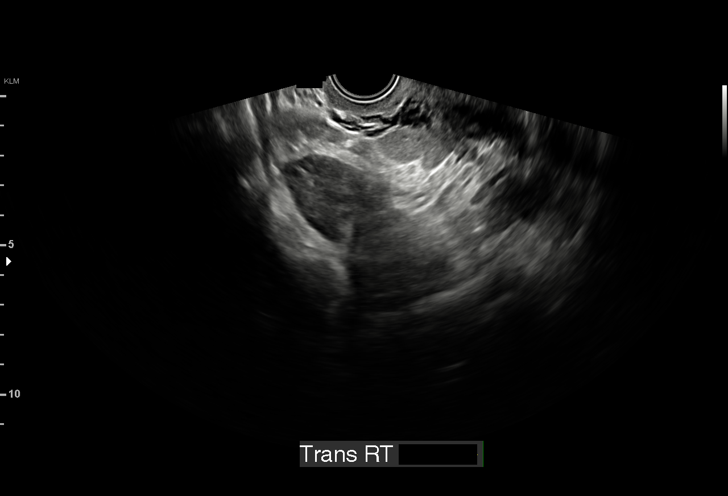
[im 47/55]
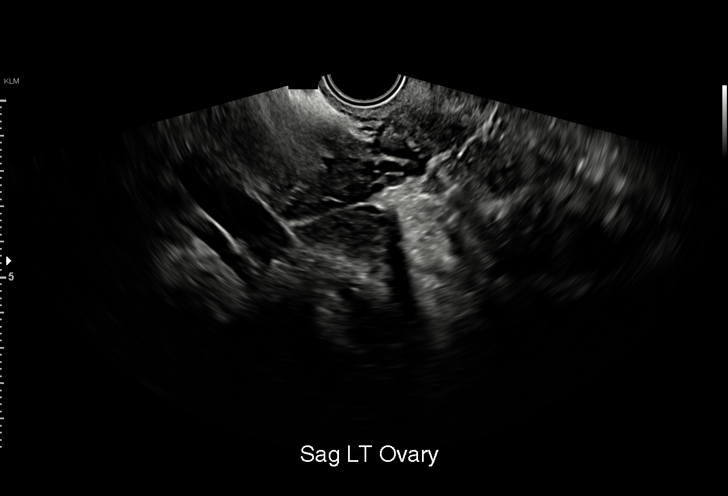
[im 51/55]
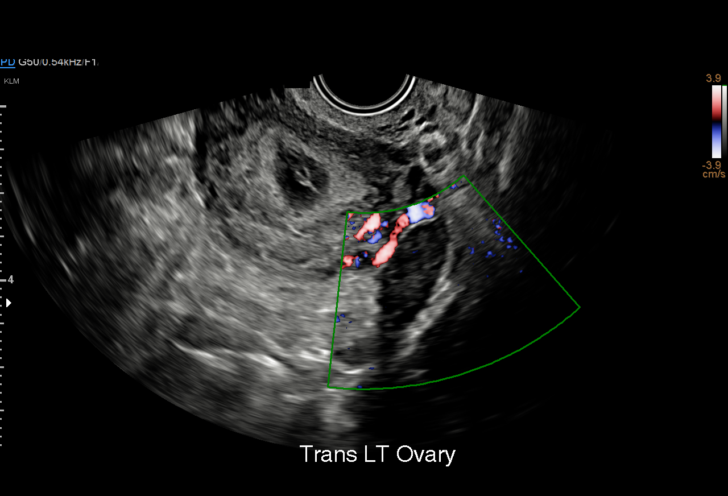
[im 55/55]
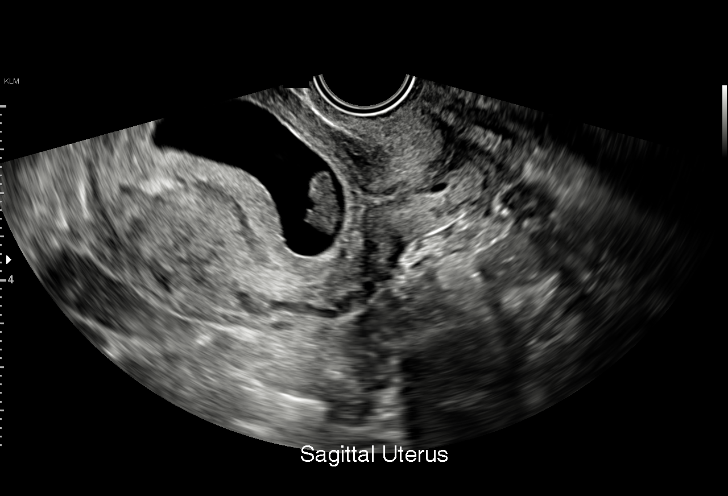

[15 of 28 positions shown; findings below may reference images not displayed]

FINDINGS: Intrauterine gestational sac: Single

Yolk sac:  Not Visualized.

Embryo:  Visualized.

Cardiac Activity: Not Visualized.  Pulseless fetus.

Heart Rate: 0 bpm

MSD: Not measured.

CRL:   18.7 mm   8 w 2 d                  US EDC: 09/16/2021

Subchorionic hemorrhage:  None visualized.

Maternal uterus/adnexae: The uterus is anteverted. The cervix is
closed measuring 2.8 cm in length with tiny nabothian cysts. The
developing placenta is posterior/fundal without a visible previa are
underlying collection. No uterine mass is seen. The ovaries are
unremarkable and normal in size. No free fluid or adnexal mass or
observed.

Color doppler evaluation of both ovaries demonstrates normal
appearing color flow.
IMPRESSION: Pulseless fetus measuring 8 weeks 2 days consistent with
intrauterine fetal demise. By LMP the gestational age should be 10
weeks 6 days according to technologist notes. No subchorionic bleed
or placenta previa are observed. The cervix is closed. There is no
adnexal mass visible or free fluid.

## 2021-12-30 LAB — HEPATITIS C ANTIBODY: HCV Ab: NEGATIVE

## 2021-12-30 LAB — OB RESULTS CONSOLE RPR: RPR: NONREACTIVE

## 2021-12-30 LAB — OB RESULTS CONSOLE RUBELLA ANTIBODY, IGM: Rubella: IMMUNE

## 2021-12-30 LAB — OB RESULTS CONSOLE HEPATITIS B SURFACE ANTIGEN: Hepatitis B Surface Ag: NEGATIVE

## 2021-12-30 LAB — OB RESULTS CONSOLE HIV ANTIBODY (ROUTINE TESTING): HIV: NONREACTIVE

## 2022-01-03 ENCOUNTER — Encounter (HOSPITAL_COMMUNITY): Payer: Self-pay | Admitting: Obstetrics

## 2022-01-03 ENCOUNTER — Other Ambulatory Visit: Payer: Self-pay

## 2022-01-03 ENCOUNTER — Inpatient Hospital Stay (HOSPITAL_BASED_OUTPATIENT_CLINIC_OR_DEPARTMENT_OTHER): Payer: 59

## 2022-01-03 ENCOUNTER — Observation Stay (HOSPITAL_COMMUNITY)
Admission: AD | Admit: 2022-01-03 | Discharge: 2022-01-04 | Disposition: A | Discharge status: Home / Self Care | Payer: 59 | Attending: Obstetrics | Admitting: Obstetrics

## 2022-01-03 DIAGNOSIS — Z3A32 32 weeks gestation of pregnancy: Secondary | ICD-10-CM | POA: Insufficient documentation

## 2022-01-03 DIAGNOSIS — O4693 Antepartum hemorrhage, unspecified, third trimester: Secondary | ICD-10-CM | POA: Diagnosis present

## 2022-01-03 DIAGNOSIS — R8279 Other abnormal findings on microbiological examination of urine: Secondary | ICD-10-CM | POA: Diagnosis not present

## 2022-01-03 DIAGNOSIS — Z87891 Personal history of nicotine dependence: Secondary | ICD-10-CM | POA: Diagnosis not present

## 2022-01-03 LAB — URINALYSIS, ROUTINE W REFLEX MICROSCOPIC
Bilirubin Urine: NEGATIVE
Glucose, UA: NEGATIVE mg/dL
Ketones, ur: 80 mg/dL — AB
Leukocytes,Ua: NEGATIVE
Nitrite: NEGATIVE
Protein, ur: 30 mg/dL — AB
Specific Gravity, Urine: 1.017 (ref 1.005–1.030)
pH: 6 (ref 5.0–8.0)

## 2022-01-03 LAB — CBC
HCT: 34.7 % — ABNORMAL LOW (ref 36.0–46.0)
Hemoglobin: 12.4 g/dL (ref 12.0–15.0)
MCH: 31.6 pg (ref 26.0–34.0)
MCHC: 35.7 g/dL (ref 30.0–36.0)
MCV: 88.5 fL (ref 80.0–100.0)
Platelets: 202 10*3/uL (ref 150–400)
RBC: 3.92 MIL/uL (ref 3.87–5.11)
RDW: 12.6 % (ref 11.5–15.5)
WBC: 10.4 10*3/uL (ref 4.0–10.5)
nRBC: 0 % (ref 0.0–0.2)

## 2022-01-03 LAB — TYPE AND SCREEN
ABO/RH(D): O POS
Antibody Screen: NEGATIVE

## 2022-01-03 LAB — WET PREP, GENITAL
Clue Cells Wet Prep HPF POC: NONE SEEN
Sperm: NONE SEEN
Trich, Wet Prep: NONE SEEN
WBC, Wet Prep HPF POC: >=10 — AB (ref <10)
Yeast Wet Prep HPF POC: NONE SEEN

## 2022-01-03 IMAGING — US US PELVIS LIMITED
1 series · 15 of 25 positions shown · non-contrast
Comparison: Obstetric ultrasound 02/06/2021

CLINICAL DATA: Status post dilation and curettage of 8 week fetus,
pain

EXAM:
TRANSABDOMINAL ULTRASOUND OF PELVIS
TECHNIQUE: Transabdominal ultrasound examination of the pelvis was performed
including evaluation of the uterus, ovaries, adnexal regions, and
pelvic cul-de-sac.

[Series 1: us pelvis limited · 15 of 44 slices shown]
[im 1/44]
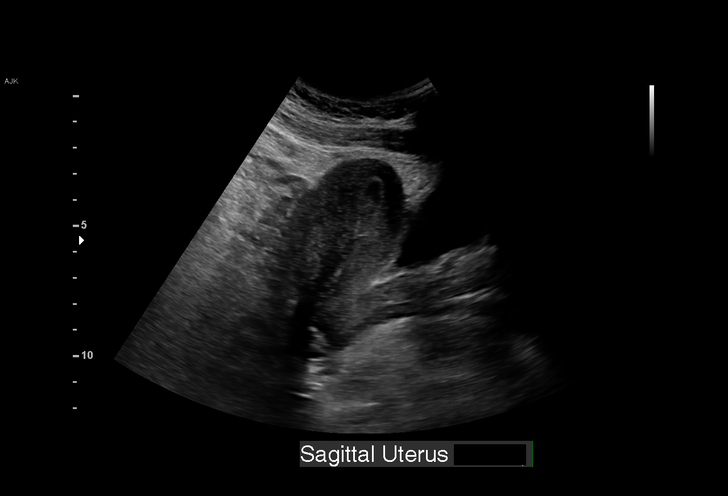
[im 4/44]
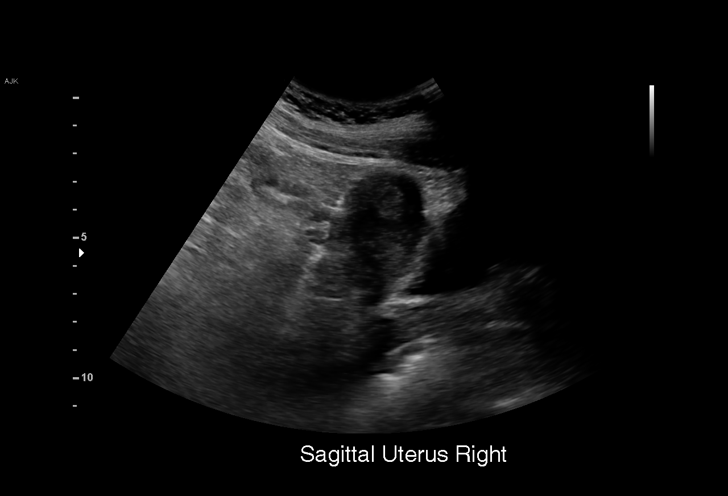
[im 8/44]
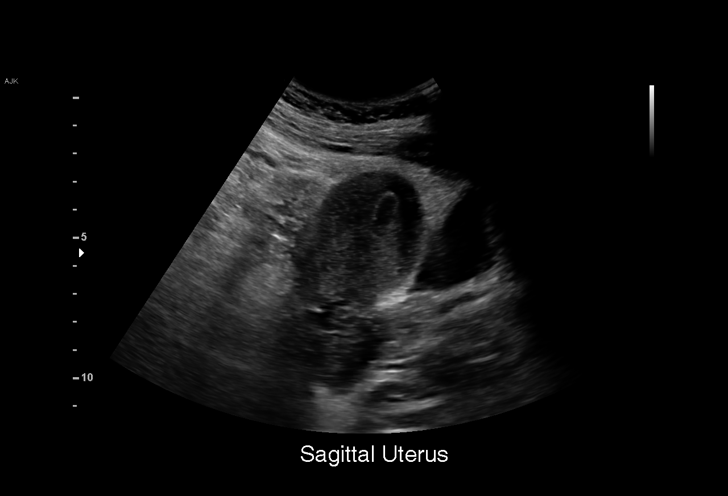
[im 9/44]
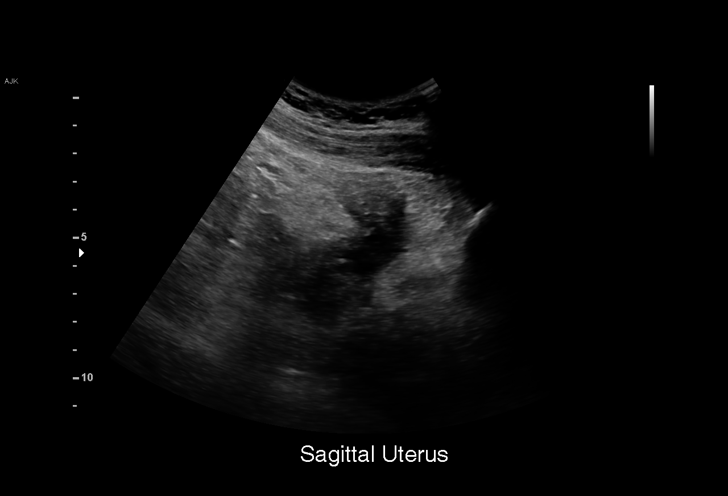
[im 13/44]
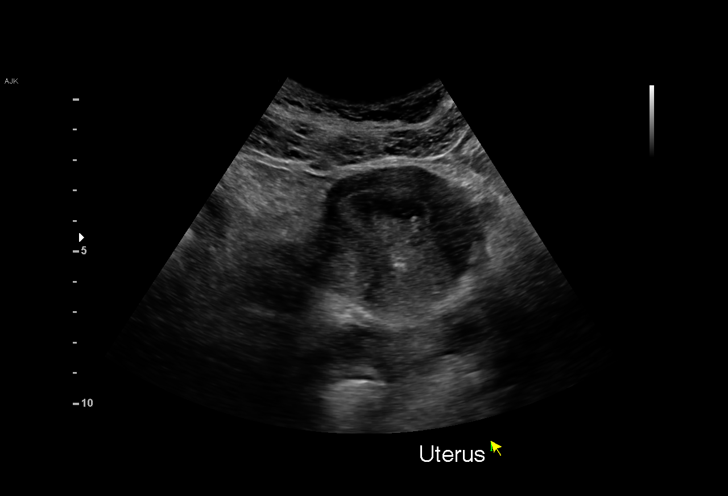
[im 17/44]
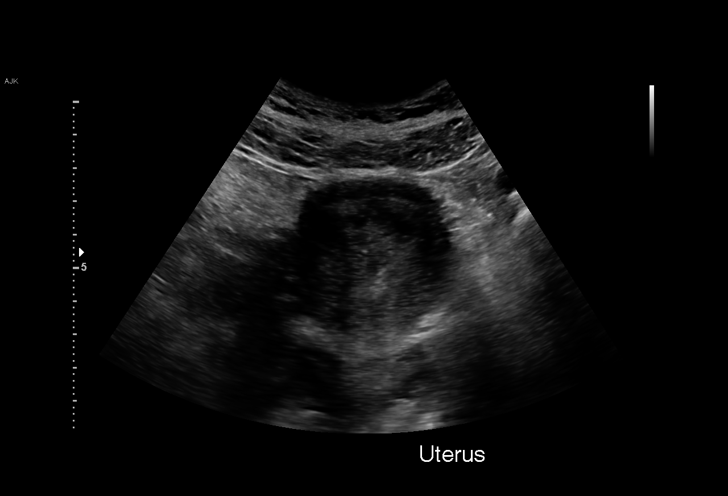
[im 18/44]
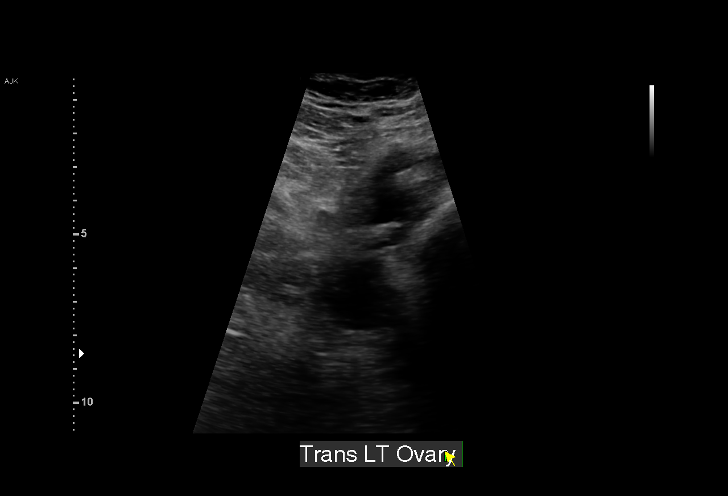
[im 22/44]
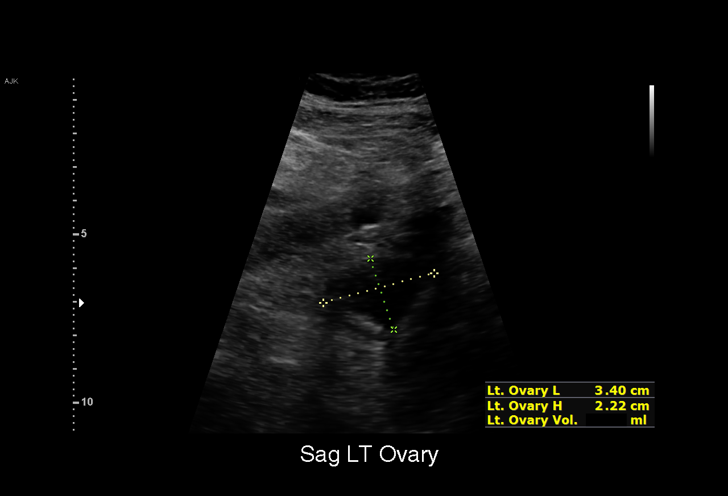
[im 26/44]
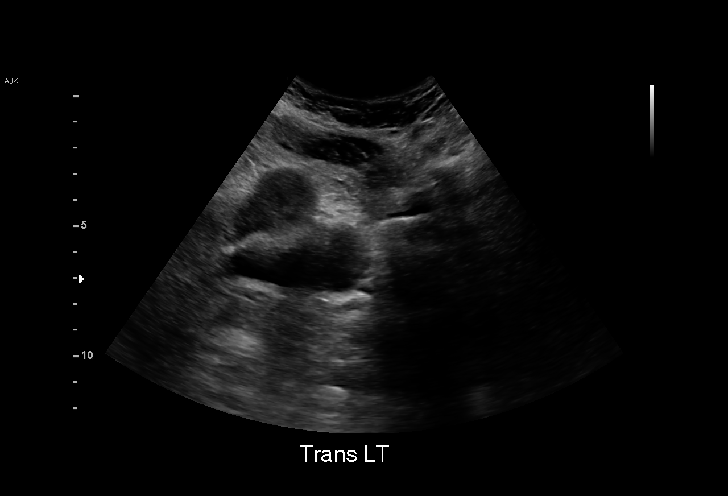
[im 27/44]
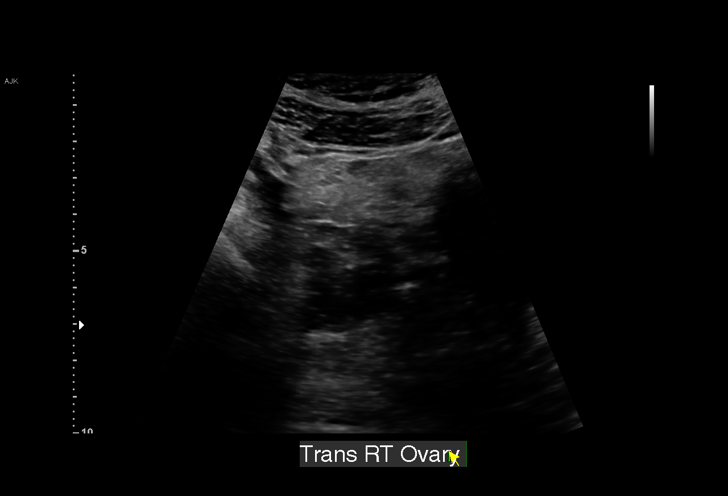
[im 31/44]
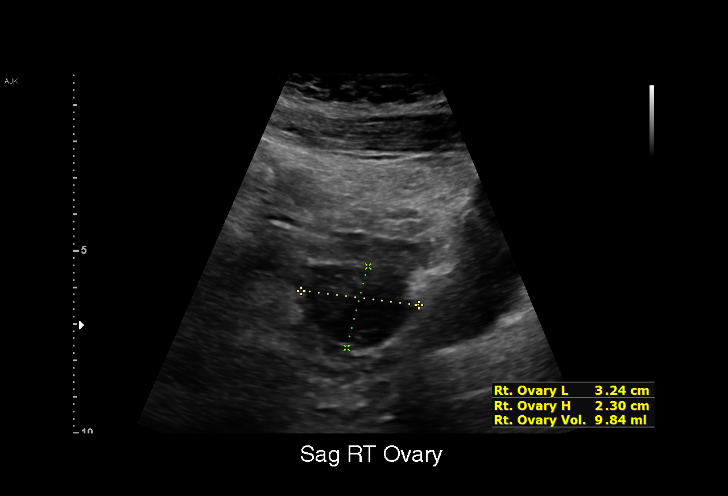
[im 35/44]
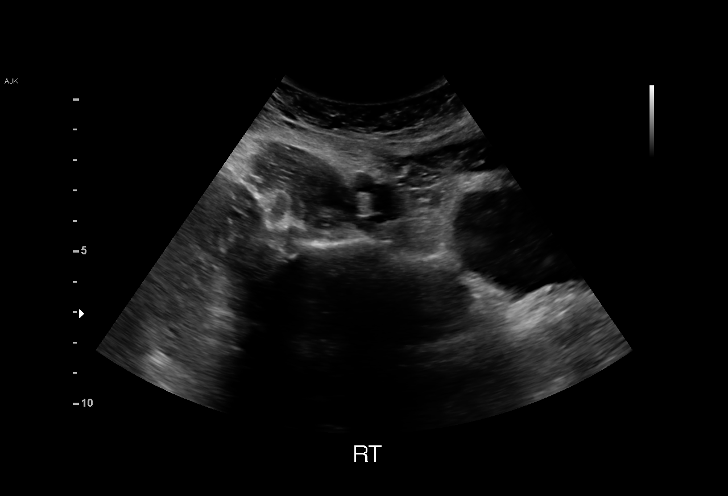
[im 36/44]
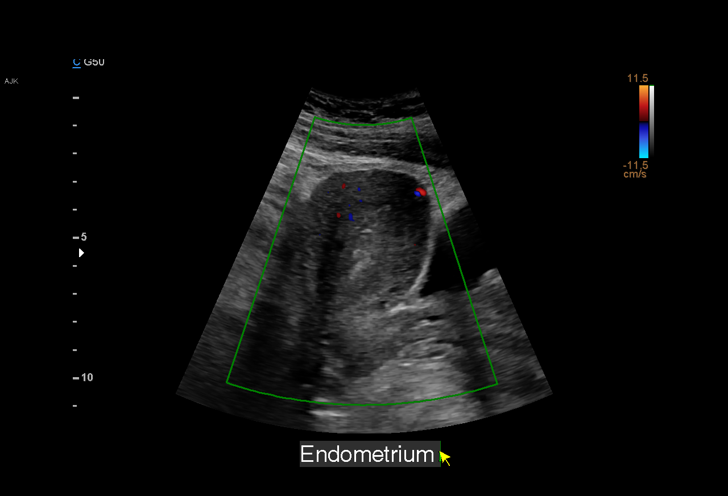
[im 40/44]
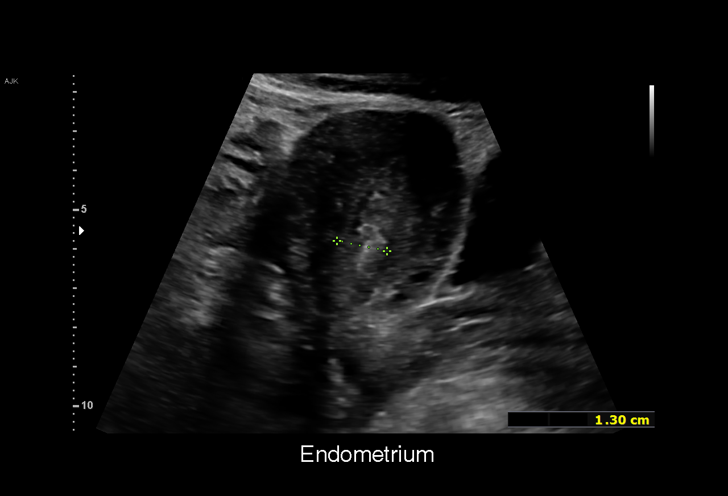
[im 44/44]
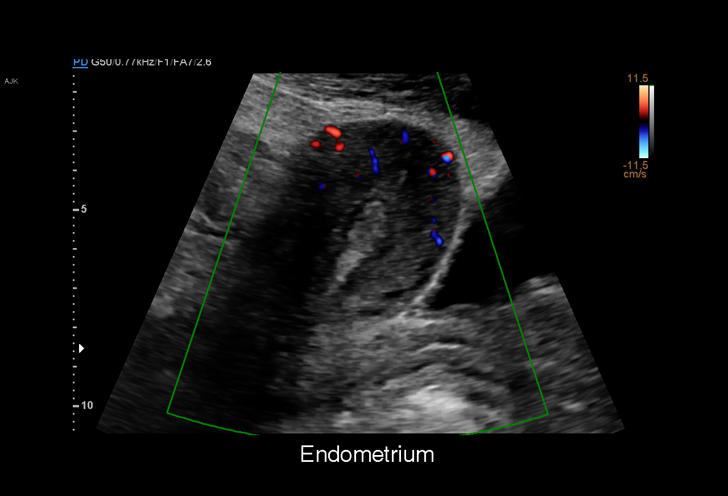

[15 of 25 positions shown; findings below may reference images not displayed]

FINDINGS: Uterus

Measurements: 8.2 cm x 4.3 cm x 4.6 cm = volume: 91 mL. No fibroids
or other mass visualized.

Endometrium

Thickness: 13 mm. The endometrium is thickened and heterogeneous in
appearance. There is no increased vascularity.

Right ovary

Measurements: 3.2 cm x 2.3 cm x 2.5 cm = volume: 9.8 mL. Normal
appearance/no adnexal mass.

Left ovary

Measurements: 3.4 cm x 2.2 cm x 2.7 cm = volume: 10.5 mL. Normal
appearance/no adnexal mass.

Other findings:  No abnormal free fluid.
IMPRESSION: Thickened and heterogeneous appearing endometrium may reflect
complex blood products. No increased vascularity is seen, but
retained products of conception cannot be entirely excluded.

## 2022-01-03 MED ORDER — DOCUSATE SODIUM 100 MG PO CAPS
100.0000 mg | ORAL_CAPSULE | Freq: Every day | ORAL | Status: DC
  Administered 2022-01-04: 100 mg via ORAL
  Filled 2022-01-03: qty 1

## 2022-01-03 MED ORDER — PANTOPRAZOLE SODIUM 40 MG PO TBEC
40.0000 mg | DELAYED_RELEASE_TABLET | Freq: Every day | ORAL | Status: DC
  Administered 2022-01-04: 40 mg via ORAL
  Filled 2022-01-03: qty 1

## 2022-01-03 MED ORDER — SODIUM CHLORIDE 0.9 % IV SOLN: 250.0000 mL | INTRAVENOUS | Status: DC | PRN

## 2022-01-03 MED ORDER — SODIUM CHLORIDE 0.9% FLUSH
3.0000 mL | Freq: Two times a day (BID) | INTRAVENOUS | Status: DC
  Administered 2022-01-04: 3 mL via INTRAVENOUS

## 2022-01-03 MED ORDER — SODIUM CHLORIDE 0.9% FLUSH: 3.0000 mL | INTRAVENOUS | Status: DC | PRN

## 2022-01-03 MED ORDER — PRENATAL MULTIVITAMIN CH
1.0000 | ORAL_TABLET | Freq: Every day | ORAL | Status: DC
  Administered 2022-01-04: 1 via ORAL
  Filled 2022-01-03: qty 1

## 2022-01-03 MED ORDER — ACETAMINOPHEN 325 MG PO TABS: 650.0000 mg | ORAL_TABLET | ORAL | Status: DC | PRN

## 2022-01-03 MED ORDER — CALCIUM CARBONATE ANTACID 500 MG PO CHEW: 2.0000 | CHEWABLE_TABLET | ORAL | Status: DC | PRN

## 2022-01-03 MED ORDER — ZOLPIDEM TARTRATE 5 MG PO TABS
5.0000 mg | ORAL_TABLET | Freq: Every evening | ORAL | Status: DC | PRN
  Administered 2022-01-04: 5 mg via ORAL
  Filled 2022-01-03: qty 1

## 2022-01-03 NOTE — MAU Note (Signed)
Samantha Jimenez is a 31 y.o. at [redacted]w[redacted]d here in MAU reporting: heavy bleeding started about 20-30 minutes ago. States blood soaked through her panties onto her pants. No pain. No LOF. DFM.  Onset of complaint: ongoing  Pain score: 0/10  Vitals:   01/03/22 1814  BP: 124/81  Pulse: (!) 105  Resp: 16  Temp: 98.2 F (36.8 C)  SpO2: 98%     FHT:138  Lab orders placed from triage: none

## 2022-01-03 NOTE — H&P (Signed)
Samantha Jimenez is a 31 y.o. G7P0060 at [redacted]w[redacted]d presenting for vaginal bleeding.  Pt notes no contractions and no LOF but noted single episode heavy vaginal bleeding at 5p. Pt notes fresh blood with wiping. Pt notes feeling some pressure but no pain and achy on both sides of her uterus. On Lovenox for Factor V Leiden and recurrent pregnancy loss.  Good fetal movement at this time though upon presentation to MAU was noting decreased FM.   Last Lovenox 10p on 01/02/22. On Lovenox for RPL but no h/o DVT/PE. No known FH VTE.   Pt notes normal pregnancy to date. Started with Samantha Jimenez then transferred to Samantha Jimenez then to Samantha Jimenez for hopes of water birth.   Pt notes h/o recurrent UTI prior to pregnancy. Single UTI this pregnancy, treated with Abx. No h/o pyelonephritis. No prophylactic meds. No dysuria at this time.   [facility] since 30 wks - pt reports normal PN Care, normal u/s. Normal DS. Pt notes normal cell free fetal DNA.  - Dated by 9 wk u/s   Meds: protonix, PNV, Lovenox All: percocet   Prenatal Transfer Tool  Maternal Diabetes: No Genetic Screening: Normal Maternal Ultrasounds/Referrals: Normal Fetal Ultrasounds or other Referrals:  None Maternal Substance Abuse:  No Significant Maternal Medications:  Meds include: Other:  Significant Maternal Lab Results: None     OB History     Gravida  7   Para      Term      Preterm      AB  6   Living  0      SAB  6   IAB      Ectopic      Multiple      Live Births             Past Medical History:  Diagnosis Date   Anemia    Anxiety    Clotting disorder (HCC)    per 02/07/21 phone call with pt, she was diagnosed with a clotting disoder at age 89, later on another doctor told her that her blood may clot to easily, she had had no treatment and has not had any problems   Heart palpitations    Pt states that she has never been diagnosed , but she does feel that her heart flutters at times. per  pt 02/07/21   History of multiple miscarriages    PCOS (polycystic ovarian syndrome)    Recurrent UTI    Past Surgical History:  Procedure Laterality Date   DILATION AND CURETTAGE OF UTERUS  02/2020   DILATION AND CURETTAGE OF UTERUS N/A 02/08/2021   Procedure: DILATATION AND CURETTAGE with evacuation;  Surgeon: Marlow Baars, MD;  Location: [facility];  Service: Gynecology;  Laterality: N/A;   Family History: family history includes Hypertension in her maternal grandmother and mother. Social History:  reports that she quit smoking about 5 years ago. Her smoking use included cigarettes. She has never used smokeless tobacco. She reports that she does not currently use alcohol after a past usage of about 4.0 standard drinks of alcohol per week. She reports that she does not use drugs.  Review of Systems - Negative except single episode bleeding     Blood pressure 115/78, pulse (!) 101, temperature 98.2 F (36.8 C), temperature source Oral, resp. rate 16, height 5\' 4"  (1.626 m), weight 83.2 kg, SpO2 99 %, unknown if currently breastfeeding.  Physical Exam: per MAU provider. Exam not repeated.  Toco: none FH: baseline 130s, accelerations present, no deceleratons, 10 beat variability  Rh pos  CBC    Component Value Date/Time   WBC 10.4 01/03/2022 2030   RBC 3.92 01/03/2022 2030   HGB 12.4 01/03/2022 2030   HCT 34.7 (L) 01/03/2022 2030   PLT 202 01/03/2022 2030   MCV 88.5 01/03/2022 2030   MCH 31.6 01/03/2022 2030   MCHC 35.7 01/03/2022 2030   RDW 12.6 01/03/2022 2030   LYMPHSABS 4.1 (H) 07/19/2020 2141   MONOABS 0.6 07/19/2020 2141   EOSABS 0.4 07/19/2020 2141   BASOSABS 0.1 07/19/2020 2141   U/s images reviewed, awaiting final report.   Assessment/Plan: 31 y.o. G7P0060 at 101w3d Single episode bright red vaginal bleeding reported by pt but no evidence on exam of any bleeding. On Lovenox. No evidence placenta previa or abruption, no evidence labor. Reactive  fetal testing. Normal CBC, normal vitals. No continued symptoms. Will hold Lovenox tonight, admit for overnight observation - h/o recurrent UTI. Check Ucx   Ala Dach 01/03/2022, 9:17 PM  Ala Dach 01/03/2022 9:41 PM   45 min spent in talking to pt, reviewing notes/ labs/ u/s and discussion with MAU team.

## 2022-01-03 NOTE — MAU Note (Signed)
Patient unable to leave a urine sample at this time.

## 2022-01-03 NOTE — MAU Provider Note (Addendum)
History     [id number]  Arrival date and time: 01/03/22 1805    Chief Complaint  Patient presents with   Vaginal Bleeding   Decreased Fetal Movement     HPI Samantha Jimenez is a 31 y.o. at [redacted]w[redacted]d with PMHx notable for factor V Leiden, who presents for vaginal bleeding.   She states that she had been at work, earlier today, when she some wetness in her underwear,and noticed some bleeding. No abdominal pain, no contractions. Bleeding soaked her underwear and she came here for evaluation right away. No prior episodes of bleeding.  She has a history of Factor V Leiden and takes daily lovenox VTE prevention in this pregnancy. No vaginal irritation, itching. Some discharge. She reports that her last [place] was at 18 weeks, and was normal, with normal placentation.  Review of outside prenatal records from [employer] (in media tab): yes. \  Vaginal bleeding: Yes LOF: No Fetal Movement: Yes Contractions: No     OB History     Gravida  7   Para      Term      Preterm      AB  6   Living  0      SAB  6   IAB      Ectopic      Multiple      Live Births              Past Medical History:  Diagnosis Date   Anemia    Anxiety    Clotting disorder (HCC)    per 02/07/21 phone call with pt, she was diagnosed with a clotting disoder at age 14, later on another doctor told her that her blood may clot to easily, she had had no treatment and has not had any problems   Heart palpitations    Pt states that she has never been diagnosed , but she does feel that her heart flutters at times. per pt 02/07/21   History of multiple miscarriages    PCOS (polycystic ovarian syndrome)    Recurrent UTI     Past Surgical History:  Procedure Laterality Date   DILATION AND CURETTAGE OF UTERUS  02/2020   DILATION AND CURETTAGE OF UTERUS N/A 02/08/2021   Procedure: DILATATION AND CURETTAGE with evacuation;  Surgeon: Marlow Baars, MD;  Location: [facility];   Service: Gynecology;  Laterality: N/A;    Family History  Problem Relation Age of Onset   Hypertension Mother    Hypertension Maternal Grandmother     Social History   Socioeconomic History   Marital status: Married    Spouse name: Not on file   Number of children: Not on file   Years of education: Not on file   Highest education level: Not on file  Occupational History   Not on file  Tobacco Use   Smoking status: Former    Types: Cigarettes    Quit date: 05/11/2016    Years since quitting: 5.6   Smokeless tobacco: Never   Tobacco comments:    1 pack per week  Vaping Use   Vaping Use: Former   Substances: Nicotine, Flavoring  Substance and Sexual Activity   Alcohol use: Not Currently    Alcohol/week: 4.0 standard drinks of alcohol    Types: 4 Glasses of wine per week    Comment: occasionally   Drug use: Never    Comment: Pt denies former marijuana use. 02/07/2021   Sexual activity: Not  Currently    Birth control/protection: None  Other Topics Concern   Not on file  Social History Narrative   Not on file   Social Determinants of Health   Financial Resource Strain: Not on file  Food Insecurity: Not on file  Transportation Needs: Not on file  Physical Activity: Not on file  Stress: Not on file  Social Connections: Not on file  Intimate Partner Violence: Not on file    Allergies  Allergen Reactions   Oxycodone-Acetaminophen Itching and Nausea And Vomiting    No current facility-administered medications on file prior to encounter.   Current Outpatient Medications on File Prior to Encounter  Medication Sig Dispense Refill   enoxaparin (LOVENOX) 40 MG/0.4ML injection Inject 40 mg into the skin daily.     Prenatal Vit-Fe Fumarate-FA (MULTIVITAMIN-PRENATAL) 27-0.8 MG TABS tablet Take 1 tablet by mouth daily at 12 noon.     COVID-19 adjuvanted vaccine, NOVAVAX, (NOVAVAX COVID-19 VACCINE) 5 MCG/0.5ML injection Inject into the muscle. 0.5 mL 0   cyclobenzaprine  (FLEXERIL) 10 MG tablet Take 1 tablet (10 mg total) by mouth 2 (two) times daily as needed for muscle spasms. 20 tablet 0   Doxylamine-Pyridoxine 10-10 MG TBEC Take 1 tablet by mouth in the morning and at bedtime. 60 tablet 0   folic acid (FOLVITE) 1 MG tablet Take 1 mg by mouth daily. Pt unsure of dosage.     polyethylene glycol (MIRALAX) 17 g packet Take 17 g by mouth daily. 14 each 0   vitamin B-12 (CYANOCOBALAMIN) 500 MCG tablet Take 500 mcg by mouth daily. Pt unsure of dosage.       Review of Systems  Constitutional:  Negative for chills and fever.  Eyes:  Negative for blurred vision.  Cardiovascular:  Negative for leg swelling.  Gastrointestinal:  Negative for abdominal pain and vomiting.  Genitourinary:  Negative for dysuria, frequency and urgency.  Musculoskeletal:  Negative for myalgias.  Skin:  Negative for itching.  Neurological:  Negative for dizziness.   Pertinent positives and negative per HPI, all others reviewed and negative  Physical Exam   BP 115/78   Pulse (!) 101   Temp 98.2 F (36.8 C) (Oral)   Resp 16   Ht 5\' 4"  (1.626 m)   Wt 83.2 kg   LMP  (LMP Unknown)   SpO2 99%   BMI 31.48 kg/m   Patient Vitals for the past 24 hrs:  BP Temp Temp src Pulse Resp SpO2 Height Weight  01/03/22 1823 115/78 -- -- (!) 101 -- 99 % -- --  01/03/22 1814 124/81 98.2 F (36.8 C) Oral (!) 105 16 98 % -- --  01/03/22 1810 -- -- -- -- -- -- 5\' 4"  (1.626 m) 83.2 kg    Physical Exam Vitals reviewed. Exam conducted with a chaperone present.  Constitutional:      General: She is not in acute distress.    Appearance: She is well-developed. She is not toxic-appearing.  HENT:     Head: Normocephalic and atraumatic.     Mouth/Throat:     Mouth: Mucous membranes are moist.  Eyes:     Extraocular Movements: Extraocular movements intact.  Cardiovascular:     Rate and Rhythm: Normal rate.  Pulmonary:     Effort: Pulmonary effort is normal. No respiratory distress.  Abdominal:      Palpations: Abdomen is soft.  Genitourinary:    Labia:        Right: No lesion.  Left: No lesion.      Vagina: Normal. No bleeding.     Cervix: No cervical motion tenderness, friability or cervical bleeding.     Comments: No ongoing bleeding or stigmata of recent bleeding noted in the cervix Skin:    General: Skin is warm and dry.  Neurological:     Mental Status: She is alert and oriented to person, place, and time.  Psychiatric:        Mood and Affect: Mood normal.        Behavior: Behavior normal.     FHT Baseline 140bpm, moderate variability, >2 15X15 accels, no decels Toco: no contractions noted Reactive NST  Labs No results found for this or any previous visit (from the past 24 hour(s)).  Imaging No results found.  MAU Course  Procedures  Lab Orders         Wet prep, genital         Urinalysis, Routine w reflex microscopic Urine, Clean Catch    No orders of the defined types were placed in this encounter.   Imaging Orders         [place] MFM OB LIMITED     MDM moderate  Results for orders placed or performed during the hospital encounter of 01/03/22 (from the past 24 hour(s))  Wet prep, genital     Status: Abnormal   Collection Time: 01/03/22  7:36 PM  Result Value Ref Range   Yeast Wet Prep HPF POC NONE SEEN NONE SEEN   Trich, Wet Prep NONE SEEN NONE SEEN   Clue Cells Wet Prep HPF POC NONE SEEN NONE SEEN   WBC, Wet Prep HPF POC >=10 (A) <10   Sperm NONE SEEN   CBC     Status: Abnormal   Collection Time: 01/03/22  8:30 PM  Result Value Ref Range   WBC 10.4 4.0 - 10.5 K/uL   RBC 3.92 3.87 - 5.11 MIL/uL   Hemoglobin 12.4 12.0 - 15.0 g/dL   HCT 31.4 (L) 97.0 - 26.3 %   MCV 88.5 80.0 - 100.0 fL   MCH 31.6 26.0 - 34.0 pg   MCHC 35.7 30.0 - 36.0 g/dL   RDW 78.5 88.5 - 02.7 %   Platelets 202 150 - 400 K/uL   nRBC 0.0 0.0 - 0.2 %  Urinalysis, Routine w reflex microscopic Urine, Clean Catch     Status: Abnormal   Collection Time: 01/03/22  8:43 PM   Result Value Ref Range   Color, Urine YELLOW YELLOW   APPearance HAZY (A) CLEAR   Specific Gravity, Urine 1.017 1.005 - 1.030   pH 6.0 5.0 - 8.0   Glucose, UA NEGATIVE NEGATIVE mg/dL   Hgb urine dipstick SMALL (A) NEGATIVE   Bilirubin Urine NEGATIVE NEGATIVE   Ketones, ur 80 (A) NEGATIVE mg/dL   Protein, ur 30 (A) NEGATIVE mg/dL   Nitrite NEGATIVE NEGATIVE   Leukocytes,Ua NEGATIVE NEGATIVE   RBC / HPF 0-5 0 - 5 RBC/hpf   WBC, UA 0-5 0 - 5 WBC/hpf   Bacteria, UA RARE (A) NONE SEEN   Squamous Epithelial / LPF 0-5 0 - 5   Mucus PRESENT     Assessment and Plan  31 y.o. G7P0060 at [redacted]w[redacted]d here with vaginal bleeding. Exam findings today shows no stigmata of bleeding from her cervix or in her vagina. Bleeding appear to have completely stopped at this time. She does have some discharge and swab samples were collected to test for vaginitis.  Ultrasound ordered  to assess fetal wellbeing and placentation.   #FWB NST: Reactive NST  I signed her out to the incoming MAU provider to continue with her management.   Liliane Channel MD MPH OB Fellow, Faculty Practice  --Care assumed of patient at 2005 --Reassuring surveillance in MAU. Based on patient's history and extent of bleeding described in ROS, advised overnight observation on OBSC --Discussed with Drs. Della Goo who agree --Per Dr. Pamala Hurry, place in obs status on [place], [place], MSN, [place] Certified Nurse Midwife, Product/process development scientist for [employer], [place]

## 2022-01-04 DIAGNOSIS — O4693 Antepartum hemorrhage, unspecified, third trimester: Secondary | ICD-10-CM | POA: Diagnosis not present

## 2022-01-04 NOTE — Progress Notes (Signed)
Second Reviewer: Odis Hollingshead, RN

## 2022-01-04 NOTE — Discharge Summary (Signed)
Patient ID: Samantha Jimenez MRN: [id number] DOB/AGE: Jul 16, 1990 31 y.o.  Admit date: 01/03/2022 Discharge date: January 04, 2022  Admission Diagnoses: Vaginal bleeding in pregnancy, third trimester [O46.93]  Discharge Diagnoses: Vaginal bleeding in pregnancy, third trimester [O46.93]         Discharged Condition: stable  Hospital Course: Patient admitted after report of single episode of heavy vaginal bleeding.  Upon arrival there is no further bleeding and no evidence of any bleeding by pelvic exam.  Vitals and blood count were stable.  Ultrasound showed normal fluid, anterior placenta no evidence of abruption or bleeding or placenta previa..  No significant contractions.  No cervical change, reactive fetal testing  Patient was admitted overnight for observation.  In the a.m. patient reports no further bleeding very rare contractions.  She does have a slight tinge of brownish discharge on her pad.  There is no leaking of fluid.  She notes good fetal movement.  Patient denies dysuria.  On exam vitals stable and reviewed Cardiovascular regular rate and rhythm Pulmonary: Clear to auscultation bilaterally Abdomen: Gravid, nontender, no right upper quadrant pain GU: Deferred Lower extremity: Nontender, no edema Back: No costovertebral angle tenderness General: Well-appearing happy smiling and feeling reassured   Consults: None and MFM  Treatments: None  Disposition: home with precautions  Allergies as of 01/04/2022       Reactions   Oxycodone-acetaminophen Itching, Nausea And Vomiting        Medication List     STOP taking these medications    cyclobenzaprine 10 MG tablet Commonly known as: FLEXERIL   Novavax COVID-19 Vaccine 5 MCG/0.5ML injection Generic drug: COVID-19 adjuvanted vaccine (NOVAVAX)       TAKE these medications    cyanocobalamin 500 MCG tablet Commonly known as: VITAMIN B12 Take 500 mcg by mouth daily. Pt unsure of dosage.    Doxylamine-Pyridoxine 10-10 MG Tbec Take 1 tablet by mouth in the morning and at bedtime.   enoxaparin 40 MG/0.4ML injection Commonly known as: LOVENOX Inject 40 mg into the skin daily.   folic acid 1 MG tablet Commonly known as: FOLVITE Take 1 mg by mouth daily. Pt unsure of dosage.   multivitamin-prenatal 27-0.8 MG Tabs tablet Take 1 tablet by mouth daily at 12 noon.   polyethylene glycol 17 g packet Commonly known as: MiraLax Take 17 g by mouth daily.         Signed: Ala Dach, MD MD 01/04/2022, 11:26 AM

## 2022-01-05 LAB — URINE CULTURE: Culture: >=100000 — AB

## 2022-01-06 LAB — GC/CHLAMYDIA PROBE AMP (~~LOC~~) NOT AT ARMC
Chlamydia: NEGATIVE
Comment: NEGATIVE
Comment: NORMAL
Neisseria Gonorrhea: NEGATIVE

## 2022-02-03 LAB — OB RESULTS CONSOLE GBS: GBS: NEGATIVE

## 2022-02-04 ENCOUNTER — Other Ambulatory Visit (HOSPITAL_COMMUNITY): Payer: Self-pay

## 2022-02-04 MED ORDER — "INSULIN SYRINGE/NEEDLE 28G X 1/2"" 1 ML MISC"
0 refills | Status: DC
  Filled 2022-02-04: qty 60, 30d supply, fill #0

## 2022-02-04 MED ORDER — HEPARIN SODIUM (PORCINE) 10000 UNIT/ML IJ SOLN
10000.0000 [IU] | Freq: Two times a day (BID) | INTRAMUSCULAR | 0 refills | Status: DC
  Filled 2022-02-04: qty 60, 30d supply, fill #0

## 2022-03-03 ENCOUNTER — Other Ambulatory Visit: Payer: Self-pay

## 2022-03-05 ENCOUNTER — Inpatient Hospital Stay (HOSPITAL_COMMUNITY): Payer: 59

## 2022-03-05 ENCOUNTER — Other Ambulatory Visit: Payer: Self-pay

## 2022-03-05 ENCOUNTER — Inpatient Hospital Stay (HOSPITAL_COMMUNITY)
Admission: AD | Admit: 2022-03-05 | Discharge: 2022-03-07 | DRG: 806 | Disposition: A | Discharge status: Home / Self Care | Payer: 59 | Attending: Obstetrics and Gynecology | Admitting: Obstetrics and Gynecology

## 2022-03-05 ENCOUNTER — Encounter (HOSPITAL_COMMUNITY): Payer: Self-pay

## 2022-03-05 DIAGNOSIS — Z8249 Family history of ischemic heart disease and other diseases of the circulatory system: Secondary | ICD-10-CM

## 2022-03-05 DIAGNOSIS — Z87891 Personal history of nicotine dependence: Secondary | ICD-10-CM | POA: Diagnosis not present

## 2022-03-05 DIAGNOSIS — Z7901 Long term (current) use of anticoagulants: Secondary | ICD-10-CM | POA: Diagnosis not present

## 2022-03-05 DIAGNOSIS — Z87440 Personal history of urinary (tract) infections: Secondary | ICD-10-CM | POA: Diagnosis not present

## 2022-03-05 DIAGNOSIS — O9912 Other diseases of the blood and blood-forming organs and certain disorders involving the immune mechanism complicating childbirth: Secondary | ICD-10-CM | POA: Diagnosis present

## 2022-03-05 DIAGNOSIS — D6851 Activated protein C resistance: Secondary | ICD-10-CM | POA: Diagnosis present

## 2022-03-05 DIAGNOSIS — O480 Post-term pregnancy: Secondary | ICD-10-CM | POA: Diagnosis present

## 2022-03-05 DIAGNOSIS — Z3A41 41 weeks gestation of pregnancy: Secondary | ICD-10-CM | POA: Diagnosis not present

## 2022-03-05 DIAGNOSIS — Z3490 Encounter for supervision of normal pregnancy, unspecified, unspecified trimester: Secondary | ICD-10-CM | POA: Diagnosis present

## 2022-03-05 HISTORY — DX: Activated protein C resistance: D68.51

## 2022-03-05 LAB — CBC
HCT: 38.4 % (ref 36.0–46.0)
Hemoglobin: 13.8 g/dL (ref 12.0–15.0)
MCH: 30.8 pg (ref 26.0–34.0)
MCHC: 35.9 g/dL (ref 30.0–36.0)
MCV: 85.7 fL (ref 80.0–100.0)
Platelets: 209 10*3/uL (ref 150–400)
RBC: 4.48 MIL/uL (ref 3.87–5.11)
RDW: 13.9 % (ref 11.5–15.5)
WBC: 9 10*3/uL (ref 4.0–10.5)
nRBC: 0 % (ref 0.0–0.2)

## 2022-03-05 LAB — RPR: RPR Ser Ql: NONREACTIVE

## 2022-03-05 LAB — TYPE AND SCREEN
ABO/RH(D): O POS
Antibody Screen: NEGATIVE

## 2022-03-05 MED ORDER — OXYTOCIN 10 UNIT/ML IJ SOLN: 10.0000 [IU] | Freq: Once | INTRAMUSCULAR | Status: DC

## 2022-03-05 MED ORDER — EPHEDRINE 5 MG/ML INJ: 10.0000 mg | INTRAVENOUS | Status: DC | PRN

## 2022-03-05 MED ORDER — MISOPROSTOL 50MCG HALF TABLET: 50.0000 ug | ORAL_TABLET | ORAL | Status: DC

## 2022-03-05 MED ORDER — LACTATED RINGERS IV SOLN
500.0000 mL | Freq: Once | INTRAVENOUS | Status: AC
  Administered 2022-03-05: 500 mL via INTRAVENOUS

## 2022-03-05 MED ORDER — MISOPROSTOL 50MCG HALF TABLET
50.0000 ug | ORAL_TABLET | ORAL | Status: DC
  Administered 2022-03-05: 50 ug via ORAL
  Filled 2022-03-05: qty 1

## 2022-03-05 MED ORDER — SOD CITRATE-CITRIC ACID 500-334 MG/5ML PO SOLN: 30.0000 mL | ORAL | Status: DC | PRN

## 2022-03-05 MED ORDER — PHENYLEPHRINE 80 MCG/ML (10ML) SYRINGE FOR IV PUSH (FOR BLOOD PRESSURE SUPPORT): 80.0000 ug | PREFILLED_SYRINGE | INTRAVENOUS | Status: DC | PRN

## 2022-03-05 MED ORDER — TERBUTALINE SULFATE 1 MG/ML IJ SOLN: 0.2500 mg | Freq: Once | INTRAMUSCULAR | Status: DC | PRN

## 2022-03-05 MED ORDER — DIPHENHYDRAMINE HCL 50 MG/ML IJ SOLN: 12.5000 mg | INTRAMUSCULAR | Status: DC | PRN

## 2022-03-05 MED ORDER — FENTANYL-BUPIVACAINE-NACL 0.5-0.125-0.9 MG/250ML-% EP SOLN
12.0000 mL/h | EPIDURAL | Status: DC | PRN
  Filled 2022-03-05: qty 250

## 2022-03-05 MED ORDER — LIDOCAINE HCL (PF) 1 % IJ SOLN
30.0000 mL | INTRAMUSCULAR | Status: AC | PRN
  Administered 2022-03-05: 30 mL via SUBCUTANEOUS
  Filled 2022-03-05: qty 30

## 2022-03-05 MED ORDER — SODIUM CHLORIDE 0.9 % IV SOLN
25.0000 mg | Freq: Four times a day (QID) | INTRAVENOUS | Status: DC | PRN
  Filled 2022-03-05: qty 1

## 2022-03-05 MED ORDER — ONDANSETRON HCL 4 MG/2ML IJ SOLN
4.0000 mg | Freq: Four times a day (QID) | INTRAMUSCULAR | Status: DC | PRN
  Administered 2022-03-05: 4 mg via INTRAVENOUS
  Filled 2022-03-05: qty 2

## 2022-03-05 MED ORDER — FENTANYL CITRATE (PF) 100 MCG/2ML IJ SOLN
100.0000 ug | Freq: Once | INTRAMUSCULAR | Status: AC
  Administered 2022-03-05: 100 ug via INTRAVENOUS
  Filled 2022-03-05: qty 2

## 2022-03-05 MED ORDER — MISOPROSTOL 25 MCG QUARTER TABLET
25.0000 ug | ORAL_TABLET | ORAL | Status: DC
  Administered 2022-03-05: 25 ug via VAGINAL
  Filled 2022-03-05: qty 1

## 2022-03-05 NOTE — H&P (Addendum)
OB ADMISSION/ HISTORY & PHYSICAL:  Admission Date: 03/05/2022 10:06 AM  Admit Diagnosis: Encounter for induction of labor [Z34.90]    Samantha Jimenez is a 31 y.o. female G7P0060 at [redacted]w[redacted]d presenting for induction of labor due to postdates and Factor V Leiden with Heparin window. FOB not involved, good support from mother, Cicily, with doula support. Expecting baby boy [place].   Prenatal History: G7P0060   EDC: 02/25/2022, Date entered prior to episode creation  Prenatal care at [facility] since 30 weeks, after transfer of care from [facility] for waterbirth option.   Primary: Arlan Organ, CNM  Prenatal course complicated by: Factor V Leiden with treatment with Lovenox until 36 weeks, when treatment was switched to Heparin.  Poor obstetric history with multiple first trimester losses. Increased risk of PPH r/t uterine scarring and potential for retained placenta.   Prenatal Labs: ABO, Rh:   O pos Antibody: NEG (10/13 2156) Rubella: Immune (10/09 0000)  RPR: Nonreactive (10/09 0000)  HBsAg: Negative (10/09 0000)  HIV: Non-reactive (10/09 0000)  GBS: Negative/-- (11/13 0000)  1 hr Glucola : 96 Genetic Screening: none Ultrasound: WNL, growth at 36 weeks: 42%, AC 48%. Placenta is posterior.     Maternal Diabetes: No Genetic Screening: Declined Maternal Ultrasounds/Referrals: Other: growth at 36 weeks, 42%, AC 48% Fetal Ultrasounds or other Referrals:  None Maternal Substance Abuse:  No Significant Maternal Medications:  Meds include: Other: Lovenox until 36 weeks, Heparin starting at 36 weeks, q12 hours, last administration 12/12 evening.  Significant Maternal Lab Results:  Group B Strep negative   Medical / Surgical History : Past medical history:  Past Medical History:  Diagnosis Date   Anemia    Anxiety    Clotting disorder (HCC)    per 02/07/21 phone call with pt, she was diagnosed with a clotting disoder at age 8, later on another doctor told her that her  blood may clot to easily, she had had no treatment and has not had any problems   Factor V Leiden (HCC)    Heart palpitations    Pt states that she has never been diagnosed , but she does feel that her heart flutters at times. per pt 02/07/21   History of multiple miscarriages    PCOS (polycystic ovarian syndrome)    Recurrent UTI     Past surgical history:  Past Surgical History:  Procedure Laterality Date   DILATION AND CURETTAGE OF UTERUS  02/2020   DILATION AND CURETTAGE OF UTERUS N/A 02/08/2021   Procedure: DILATATION AND CURETTAGE with evacuation;  Surgeon: Marlow Baars, MD;  Location: [facility] [place];  Service: Gynecology;  Laterality: N/A;    Family History:  Family History  Problem Relation Age of Onset   Hypertension Mother    Hypertension Maternal Grandmother     Social History:  reports that she quit smoking about 5 years ago. Her smoking use included cigarettes. She has never used smokeless tobacco. She reports that she does not currently use alcohol after a past usage of about 4.0 standard drinks of alcohol per week. She reports that she does not use drugs. Father of baby is not involved. Labor support includes her mother, Orland Mustard and doula, Baird Lyons.   Allergies: Oxycodone-acetaminophen   Current Medications at time of admission:  Medications Prior to Admission  Medication Sig Dispense Refill Last Dose   heparin 63875 UNIT/ML injection Inject 1 mL (10,000 Units total) into the skin 2 (two) times daily. 60 mL 0 03/04/2022 at 2200  Doxylamine-Pyridoxine 10-10 MG TBEC Take 1 tablet by mouth in the morning and at bedtime. 60 tablet 0    enoxaparin (LOVENOX) 40 MG/0.4ML injection Inject 40 mg into the skin daily.      folic acid (FOLVITE) 1 MG tablet Take 1 mg by mouth daily. Pt unsure of dosage.      INS SYRINGE/NEEDLE 1CC/28G 28G X 1/2" 1 ML MISC Use with Heparin injections in pregnancy 60 each 0    polyethylene glycol (MIRALAX) 17 g packet Take 17 g by mouth  daily. 14 each 0    Prenatal Vit-Fe Fumarate-FA (MULTIVITAMIN-PRENATAL) 27-0.8 MG TABS tablet Take 1 tablet by mouth daily at 12 noon.      vitamin B-12 (CYANOCOBALAMIN) 500 MCG tablet Take 500 mcg by mouth daily. Pt unsure of dosage.        Review of Systems: Review of Systems  Constitutional: Negative.   HENT: Negative.    Eyes: Negative.   Respiratory: Negative.    Cardiovascular: Negative.   Gastrointestinal:  Positive for constipation.  Genitourinary: Negative.   Musculoskeletal: Negative.   Skin: Negative.   Neurological: Negative.   Endo/Heme/Allergies:        Factor V  Psychiatric/Behavioral: Negative.      Physical Exam: Vital signs and nursing notes reviewed.  Patient Vitals for the past 24 hrs:  BP Temp Temp src Pulse Resp Height Weight  03/05/22 1113 111/71 97.8 F (36.6 C) Oral 96 16 -- --  03/05/22 1112 -- -- -- -- -- 5\' 4"  (1.626 m) 87.1 kg     General: AAO x 3, NAD Heart: RRR Lungs:CTAB Abdomen: Gravid, NT Extremities: no edema SVE: Dilation: 3 Effacement (%): 70 Station: -2 Presentation: Vertex Exam by:: [email or web address], SNM   FHR: 150 BPM, moderate variability, present accels, variable decels TOCO: Contractions, present, irregular  Labs:   No results for input(s): "WBC", "HGB", "HCT", "PLT" in the last 72 hours.  Assessment/Plan: 31 y.o. G7P0060 at [redacted]w[redacted]d  Factor V Leiden - treatment with Lovenox until 36 weeks, Heparin therapy thereafter. Last administration 12/12 at 2100. Will resume Lovenox 12 hours PP.   Poor obstetric history with multiple first trimester losses. Increased risk of PPH r/t uterine scarring and potential for retained placenta. Plan AMTSL.   Fetal wellbeing - FHT category 1 EFW AGA.   Labor: induction of labor for post-dates, Factor V Leiden with heparin window. Administration of Cytotec 50 mcg buccal, 25 mcg vaginally for cervical ripening. Membrane sweep performed with patient's permission. Will consider pitocin, AROM PRN.   GBS  negative Rubella immune  Rh positive  Pain control: desires hydrotherapy. Encouraged position changes, hydrotherapy in shower as desired until active labor, when patient may use tub for hydrotherapy. Mother, Cicily, present at bedside for labor support. Encouraged to call doula when working through contractions.  Analgesia/anesthesia PRN  Anticipated MOD: NVSB  Plans to breastfeed, undecided circumcision. POC discussed with patient and support team, all questions answered.  Dr 2101 notified of admission/plan of care.  Amado Nash SNM 03/05/2022, 11:55 AM    Medical screening examination/treatment/procedure(s) were conducted as a shared visit with non-physician practitioner(s) and myself.  I personally evaluated the patient during the encounter.   03/07/2022, CNM, MSN 03/05/2022, 3:00 PM

## 2022-03-05 NOTE — Progress Notes (Signed)
Samantha Jimenez is a 31 y.o. G7P0060 at [redacted]w[redacted]d admitted for Encounter for induction of labor [Z34.90]  Subjective:  Started to feel more pain with ctx, using various positions for comfort. Mother Cicily at [facility] and supportive.   S/P cytotec x 1 dose.   Objective: Vitals:   03/05/22 1113 03/05/22 1249 03/05/22 1427 03/05/22 1510  BP: 111/71 116/76 107/69 111/79  Pulse: 96 100 99 100  Resp: 16 16    Temp: 97.8 F (36.6 C)     TempSrc: Oral     Weight:      Height:           FHT:  FHR: 150 bpm, variability: moderate,  accelerations:  Present,  decelerations:  Absent UC:   regular, every 2-3 minutes SVE:   Dilation: 4 Effacement (%): 70 Station: -2 Exam by:: Ivonne Andrew, CNM Posterior, vertex. IBOW Labs:   Recent Labs    03/05/22 1125  WBC 9.0  HGB  13.8  HCT 38.4  PLT 209    Assessment / Plan: G7P0060 31 y.o. [redacted]w[redacted]d IOL at term FVL in heparin window Labor:  Cervical ripening effective on one dose cyctoec, anticipate physiologic labor.  AROM as needed Encouraged continued positions changes, using BB at [facility].  Preeclampsia:  no signs or symptoms of toxicity and intake and ouput balanced Fetal Wellbeing:  Category I Pain Control:  Labor support without medications and plans hydrotherapy I/D:   GBS neg  Anticipated MOD:   working towards NSVB  Neta Mends, CNM, MSN 03/05/2022, 4:46 PM

## 2022-03-05 NOTE — Progress Notes (Signed)
Samantha Jimenez is a 31 y.o. G7P0060 at [redacted]w[redacted]d admitted for Encounter for induction of labor [Z34.90]  Subjective:   Objective: Vitals:   03/05/22 1249 03/05/22 1427 03/05/22 1510 03/05/22 1923  BP: 116/76 107/69 111/79 99/63  Pulse: 100 99 100 100  Resp: 16   18  Temp:    99.2 F (37.3 C)  TempSrc:    Oral  Weight:      Height:          FHT:  intermittent monitoring baseline 145, moderate variability, no decels, pos accels  UC:   regular, every 2-3 minutes SVE:   Dilation: 4.5 Effacement (%): 90 Station: -2 Exam by:: S [employer] CNM Student  Labs:   Recent Labs    03/05/22 1125  WBC 9.0  HGB 13.8  HCT 38.4  PLT 209    Assessment / Plan: G7P0060 31 y.o. [redacted]w[redacted]d Induction of labor due to FVL in Heparin window, and term,  progressing well on after Cytotec and AROM. AROM of large amount of clear fluid. Fetal head well applied to cervix.  Labor:  Progressing now after cytotec and AROM   Continue to monitor. Continue positon changes after rest period with  Fetal Wellbeing:   intermittent monitoring reassuring Pain Control:  Briefly in tub, IV pain medication now, phenergan ordered, may return to tub an hour after administration of IV narcotic. Continued bedside support from mother [place] and doula, Milli.  GBS neg  Anticipated MOD:   working toward NVSB  Lamont Snowball, SNM 03/05/2022, 9:05 PM

## 2022-03-06 ENCOUNTER — Encounter (HOSPITAL_COMMUNITY): Payer: Self-pay

## 2022-03-06 DIAGNOSIS — O9912 Other diseases of the blood and blood-forming organs and certain disorders involving the immune mechanism complicating childbirth: Secondary | ICD-10-CM | POA: Diagnosis present

## 2022-03-06 LAB — CBC
HCT: 32.3 % — ABNORMAL LOW (ref 36.0–46.0)
Hemoglobin: 11.3 g/dL — ABNORMAL LOW (ref 12.0–15.0)
MCH: 30.3 pg (ref 26.0–34.0)
MCHC: 35 g/dL (ref 30.0–36.0)
MCV: 86.6 fL (ref 80.0–100.0)
Platelets: 197 10*3/uL (ref 150–400)
RBC: 3.73 MIL/uL — ABNORMAL LOW (ref 3.87–5.11)
RDW: 13.9 % (ref 11.5–15.5)
WBC: 16.9 10*3/uL — ABNORMAL HIGH (ref 4.0–10.5)
nRBC: 0 % (ref 0.0–0.2)

## 2022-03-06 MED ORDER — ONDANSETRON HCL 4 MG PO TABS: 4.0000 mg | ORAL_TABLET | ORAL | Status: DC | PRN

## 2022-03-06 MED ORDER — OXYTOCIN-SODIUM CHLORIDE 30-0.9 UT/500ML-% IV SOLN
INTRAVENOUS | Status: AC
  Filled 2022-03-06: qty 500

## 2022-03-06 MED ORDER — MISOPROSTOL 200 MCG PO TABS
ORAL_TABLET | ORAL | Status: AC
  Administered 2022-03-06: 400 ug via BUCCAL
  Administered 2022-03-06: 400 ug via RECTAL
  Filled 2022-03-06: qty 4

## 2022-03-06 MED ORDER — BENZOCAINE-MENTHOL 20-0.5 % EX AERO
1.0000 | INHALATION_SPRAY | CUTANEOUS | Status: DC | PRN
  Filled 2022-03-06: qty 56

## 2022-03-06 MED ORDER — COCONUT OIL OIL: 1.0000 | TOPICAL_OIL | Status: DC | PRN

## 2022-03-06 MED ORDER — ACETAMINOPHEN 325 MG PO TABS
650.0000 mg | ORAL_TABLET | ORAL | Status: DC | PRN
  Administered 2022-03-06 – 2022-03-07 (×3): 650 mg via ORAL
  Filled 2022-03-06 (×3): qty 2

## 2022-03-06 MED ORDER — SENNOSIDES-DOCUSATE SODIUM 8.6-50 MG PO TABS
2.0000 | ORAL_TABLET | Freq: Every day | ORAL | Status: DC
  Administered 2022-03-07: 2 via ORAL
  Filled 2022-03-06: qty 2

## 2022-03-06 MED ORDER — DIPHENHYDRAMINE HCL 25 MG PO CAPS: 25.0000 mg | ORAL_CAPSULE | Freq: Four times a day (QID) | ORAL | Status: DC | PRN

## 2022-03-06 MED ORDER — ENOXAPARIN SODIUM 40 MG/0.4ML IJ SOSY
40.0000 mg | PREFILLED_SYRINGE | INTRAMUSCULAR | Status: DC
  Administered 2022-03-06 – 2022-03-07 (×2): 40 mg via SUBCUTANEOUS
  Filled 2022-03-06 (×2): qty 0.4

## 2022-03-06 MED ORDER — DIBUCAINE (PERIANAL) 1 % EX OINT: 1.0000 | TOPICAL_OINTMENT | CUTANEOUS | Status: DC | PRN

## 2022-03-06 MED ORDER — TETANUS-DIPHTH-ACELL PERTUSSIS 5-2.5-18.5 LF-MCG/0.5 IM SUSY: 0.5000 mL | PREFILLED_SYRINGE | Freq: Once | INTRAMUSCULAR | Status: DC

## 2022-03-06 MED ORDER — PRENATAL MULTIVITAMIN CH
1.0000 | ORAL_TABLET | Freq: Every day | ORAL | Status: DC
  Administered 2022-03-06 – 2022-03-07 (×2): 1 via ORAL
  Filled 2022-03-06 (×2): qty 1

## 2022-03-06 MED ORDER — WITCH HAZEL-GLYCERIN EX PADS: 1.0000 | MEDICATED_PAD | CUTANEOUS | Status: DC | PRN

## 2022-03-06 MED ORDER — ONDANSETRON HCL 4 MG/2ML IJ SOLN: 4.0000 mg | INTRAMUSCULAR | Status: DC | PRN

## 2022-03-06 MED ORDER — SIMETHICONE 80 MG PO CHEW: 80.0000 mg | CHEWABLE_TABLET | ORAL | Status: DC | PRN

## 2022-03-06 MED ORDER — IBUPROFEN 600 MG PO TABS
600.0000 mg | ORAL_TABLET | Freq: Four times a day (QID) | ORAL | Status: DC
  Administered 2022-03-06 – 2022-03-07 (×5): 600 mg via ORAL
  Filled 2022-03-06 (×6): qty 1

## 2022-03-06 NOTE — Social Work (Signed)
MOB was referred for history of depression/anxiety.  * Referral screened out by Clinical Social Worker because none of the following criteria appear to apply:  ~ History of anxiety/depression during this pregnancy, or of post-partum depression following prior delivery.  ~ Diagnosis of anxiety and/or depression within last 3 years OR * MOB's symptoms currently being treated with medication and/or therapy.  Per chart review, MOB diagnosed prior to December 2020. Per OB records, MOB had no noted symptoms during this pregnancy.  Please contact the Clinical Social Worker if needs arise, by [facility] request, or if MOB scores greater than 9/yes to question 10 on [place] Postpartum Depression Screen.  Wende Neighbors, LCSWA Clinical Social Worker [phone]

## 2022-03-06 NOTE — Progress Notes (Signed)
       PPD # 1 S/P NSVD  Live born female  Birth Weight: 7 lb 6.5 oz (3360 g) APGAR: 8, 9  Newborn Delivery   Birth date/time: 03/05/2022 23:43:45 Delivery type: Vaginal, Spontaneous     Baby name: Samantha Jimenez Delivering provider: Lamont Snowball A  Episiotomy:None   Lacerations:2nd degree   circumcision declined  Feeding: breast  Pain control at delivery: None   S:  Reports feeling well, happy with birth experience.              Tolerating po/ No nausea or vomiting             Bleeding is moderate             Pain controlled with acetaminophen and ibuprofen (OTC)             Up ad lib / ambulatory / voiding without difficulties   O:  A & O x 3, in no apparent distress              VS:  Vitals:   03/06/22 0116 03/06/22 0131 03/06/22 0231 03/06/22 0336  BP: (!) 118/50 95/64 113/75 115/80  Pulse: 99 (!) 130 (!) 109 95  Resp:   18 18  Temp:   99.1 F (37.3 C) 100.3 F (37.9 C)  TempSrc:   Oral Oral  SpO2:   99% 98%  Weight:      Height:        LABS:  Recent Labs    03/05/22 1125 03/06/22 0612  WBC 9.0 16.9*  HGB 13.8 11.3*  HCT 38.4 32.3*  PLT 209 197    Blood type: --/--/O POS (12/13 1125)  Rubella: Immune (10/09 0000)   I&O: I/O last 3 completed shifts: In: 15 [P.O.:15] Out: 567 [Blood:567]          No intake/output data recorded.  Vaccines: TDaP          UTD  Plans RSV vaccine outpatient   Gen: AAO x 3, NAD  Abdomen: soft, non-tender, non-distended             Fundus: firm, non-tender, U-1  Perineum: repair intact  Lochia: small  Extremities: no edema, no calf pain or tenderness    A/P: PPD # 1 31 y.o., [id number]   Principal Problem:   Encounter for induction of labor Active Problems:   Postpartum care following vaginal delivery 12/13   SVD (spontaneous vaginal delivery)   Perineal laceration, second degree   Pregnancy complic by factor V Leiden mutation, deliv, curr hospitaliz (HCC)  On Lovenox until 36 wks, then heparin 10K until day  before delivery Will resume Lovenox 40 MEQ daily at 12 hrs PP Follow up with hematology outpatient.     Doing well - stable status  Routine post partum orders  Anticipate discharge tomorrow    Neta Mends, MSN, CNM 03/06/2022, 9:26 AM

## 2022-03-06 NOTE — Lactation Note (Signed)
This note was copied from a baby's chart. Lactation Consultation Note  Patient Name: Samantha Jimenez Date: 03/06/2022 Reason for consult: Initial assessment;Breastfeeding assistance;Term;Primapara;1st time breastfeeding;RN request;Mother's request Age:30 hours  LC entered the room and the infant was in the bassinet.  Per the birth parent, things are going well with breastfeeding.  She stated that yesterday, she was breastfeeding and the infant caused blisters on her breasts.  LC assisted the birth parent with working on positioning the infant at the breast.  The birth parent put the infant to the left breast in the cross-cradle position.  The infant latched with his lips flanged, sucking was rhythmic, and swallows were noted. The infant burped and was placed on the right breast in the football position. The infant latched with flanged lips, sucking was rhythmic, and swallows were noted.  The infant fed for 45 min and was still feeding when LC left the room.  The birth parent was shown how to use, assemble, and wash the manual breast pump.  LC reviewed the outpatient LC brochure.  All questions were answered.   Infant Feeding Plan:  Breastfeed 8+ times in 24 hour according to feeding cues.  Hand express and spoon feed EBM to the infant.  Call RN/LC for assistance with breastfeeding.   Maternal Data Has patient been taught Hand Expression?: Yes Does the patient have breastfeeding experience prior to this delivery?: No  Feeding Mother's Current Feeding Choice: Breast Milk  LATCH Score Latch: Grasps breast easily, tongue down, lips flanged, rhythmical sucking.  Audible Swallowing: Spontaneous and intermittent  Type of Nipple: Everted at rest and after stimulation  Comfort (Breast/Nipple): Soft / non-tender  Hold (Positioning): Assistance needed to correctly position infant at breast and maintain latch.  LATCH Score: 9   Lactation Tools Discussed/Used Tools:  Pump Breast pump type: Manual Pump Education: Setup, frequency, and cleaning Reason for Pumping: Birth parent's request Pumping frequency: PRN  Interventions Interventions: Breast feeding basics reviewed;Assisted with latch;Breast compression;Adjust position;Support pillows;Hand pump;Education;LC Services brochure  Discharge Pump: DEBP;Personal  Consult Status Consult Status: Follow-up Date: 03/07/22 Follow-up type: In-patient    Orvil Feil Enrique Manganaro 03/06/2022, 9:03 AM

## 2022-03-07 MED ORDER — ENOXAPARIN SODIUM 40 MG/0.4ML IJ SOSY: 40.0000 mg | PREFILLED_SYRINGE | INTRAMUSCULAR | 0 refills | Status: AC

## 2022-03-07 MED ORDER — ACETAMINOPHEN 325 MG PO TABS: 650.0000 mg | ORAL_TABLET | ORAL | Status: AC | PRN

## 2022-03-07 MED ORDER — IBUPROFEN 600 MG PO TABS: 600.0000 mg | ORAL_TABLET | Freq: Four times a day (QID) | ORAL | 0 refills | Status: AC

## 2022-03-07 NOTE — Social Work (Signed)
CSW received consult for hx of Anxiety and Depression and an EPDS score of 21.  CSW met with MOB to offer support and complete assessment. CSW entered the room introduced self, CSW role and reason for visit. MOB was agreeable to visit and allowed her mom to remain in the room during the assessment. CSW   CSW inquired about MOB MH hx, MOB reported she was diagnosed with Anxiety and Depression in 2018. MOB reported she does not currently take any medication but prior to pregnancy was taking Prozac for her anxiety. CSW inquired about how she copes with her symptoms, MOB reports she isolates herself and tries to distract herself with other things. CSW inquired about therapy services MOB reported she is interested in therapy, CSW provided therapy resources. MOB reported her anxiety has been heightened due to her preparing to navigate  motherhood as a single parent. CSW assessed for safety, MOB denied any SI, HI or DV. CSW provided education regarding the baby blues period vs. perinatal mood disorders, discussed treatment and gave resources for mental health follow up if concerns arise.  CSW recommends self-evaluation during the postpartum time period using the New Mom Checklist from Postpartum Progress and encouraged MOB to contact a medical professional if symptoms are noted at any time.  MOB reported having support from her mom but no other family nearby. MOB reported they have plans to move back [place] to be closer to family.    CSW provided review of Sudden Infant Death Syndrome (SIDS) precautions. MOB identified [facility] for infant follow up care. MOB reported they have all necessary items for the infant including a bassinet for him to sleep. MOB reported they cold use a resource for diaper and wipes. CSW made referral to [employer].  MOB reported she would be interested in lactation support, CSW provided Lactation resources. CSW identifies no further need for intervention and no barriers to discharge  at this time.  Letta Kocher, [place] Social Worker [phone]

## 2022-03-07 NOTE — Discharge Summary (Signed)
Postpartum Discharge Summary  Patient Name: Samantha Jimenez DOB: Jul 28, 1990 MRN: [id number]  Date of admission: 03/05/2022 Delivery date:03/05/2022  Delivering provider: Conan Jimenez A / Samantha Jimenez CNM  Date of discharge: 03/07/2022  Admitting diagnosis: Encounter for induction of labor [Z34.90] Intrauterine pregnancy: [redacted]w[redacted]d    Secondary diagnosis:  Postpartum care following vaginal delivery 12/13   Perineal laceration, second degree   Pregnancy complic by factor V Leiden mutation Anxiety    Discharge diagnosis: Term Pregnancy Delivered                                              Post partum procedures: None Augmentation: AROM Complications: None  Hospital course: Induction of Labor With Vaginal Delivery   31y.o. yo [id number] admitted to the hospital 03/05/2022 for induction of labor.  Indication for induction: Postdates.  Patient had an labor course complicated by nothing  Membrane Rupture Time/Date: 8:16 PM ,03/05/2022   Delivery Method:Vaginal, Spontaneous  Episiotomy: None  Lacerations:  2nd degree  Details of delivery can be found in separate delivery note.  Patient had a postpartum course complicated by none. Patient is discharged home 03/07/22.  Newborn Data: Birth date:03/05/2022  Birth time:11:43 PM  Gender:Female  Living status:Living  Apgars:8 ,9  Weight:3360 g   Magnesium Sulfate received: NA BMZ received: NA  Rhophylac:N/A MMR:N/A T-DaP:Given prenatally Flu: N/A Transfusion:No  Physical exam  Vitals:   03/06/22 1154 03/06/22 1529 03/06/22 2040 03/07/22 0545  BP: 103/70 104/66 106/69 96/62  Pulse: 93 95 83 89  Resp: _0 Temp:  97.9 F (36.6 C) 98.1 F (36.7 C) 98.2 F (36.8 C)  TempSrc: Oral Oral    SpO2: 99% 97% 98% 98%  Weight:      Height:       General: alert, cooperative, and no distress Lochia: appropriate Uterine Fundus: firm Incision: Healing well with no significant drainage, No significant  erythema DVT Evaluation: No evidence of DVT seen on physical exam. Negative Homan's sign. Labs: Lab Results  Component Value Date   WBC 16.9 (H) 03/06/2022   HGB 11.3 (L) 03/06/2022   HCT 32.3 (L) 03/06/2022   MCV 86.6 03/06/2022   PLT 197 03/06/2022      Latest Ref Rng & Units 07/19/2020    9:41 PM  CMP  Glucose 70 - 99 mg/dL 87   BUN 6 - 20 mg/dL 12   Creatinine 0.44 - 1.00 mg/dL 0.68   Sodium 135 - 145 mmol/L 138   Potassium 3.5 - 5.1 mmol/L 3.8   Chloride 98 - 111 mmol/L 103   CO2 22 - 32 mmol/L 26   Calcium 8.9 - 10.3 mg/dL 9.1   Total Protein 6.5 - 8.1 g/dL 7.4   Total Bilirubin 0.3 - 1.2 mg/dL 0.4   Alkaline Phos 38 - 126 U/L 66   AST 15 - 41 U/L 13   ALT 0 - 44 U/L 9    Edinburgh Score:    03/07/2022    3:00 AM  Edinburgh Postnatal Depression Scale Screening Tool  I have been able to laugh and see the funny side of things. 3  I have looked forward with enjoyment to things. 1  I have blamed myself unnecessarily when things went wrong. 2  I have been anxious or worried for no good reason. 3  I have felt scared or panicky for no good reason. 3  Things have been getting on top of me. 2  I have been so unhappy that I have had difficulty sleeping. 3  I have felt sad or miserable. 2  I have been so unhappy that I have been crying. 2  The thought of harming myself has occurred to me. 0  Edinburgh Postnatal Depression Scale Total 21      After visit meds:  Allergies as of 03/07/2022       Reactions   Oxycodone-acetaminophen Itching, Nausea And Vomiting        Medication List     STOP taking these medications    Doxylamine-Pyridoxine 59-93 MG Tbec   folic acid 1 MG tablet Commonly known as: FOLVITE   heparin 10000 UNIT/ML injection   INS SYRINGE/NEEDLE 1CC/28G 28G X 1/2" 1 ML Misc   polyethylene glycol 17 g packet Commonly known as: MiraLax       TAKE these medications    acetaminophen 325 MG tablet Commonly known as: Tylenol Take 2  tablets (650 mg total) by mouth every 4 (four) hours as needed (for pain scale < 4).   cyanocobalamin 500 MCG tablet Commonly known as: VITAMIN B12 Take 500 mcg by mouth daily. Pt unsure of dosage.   enoxaparin 40 MG/0.4ML injection Commonly known as: LOVENOX Inject 40 mg into the skin daily. What changed: Another medication with the same name was added. Make sure you understand how and when to take each.   enoxaparin 40 MG/0.4ML injection Commonly known as: LOVENOX Inject 0.4 mLs (40 mg total) into the skin daily. Continue till 6 weeks postpartum What changed: You were already taking a medication with the same name, and this prescription was added. Make sure you understand how and when to take each.   ibuprofen 600 MG tablet Commonly known as: ADVIL Take 1 tablet (600 mg total) by mouth every 6 (six) hours.   multivitamin-prenatal 27-0.8 MG Tabs tablet Take 1 tablet by mouth daily at 12 noon.         Discharge home in stable condition Infant Feeding: Bottle and Breast Infant Disposition:home with mother Discharge instruction: per After Visit Summary and Postpartum booklet. Activity: Advance as tolerated. Pelvic rest for 6 weeks.  Diet: routine diet Anticipated Birth Control: Unsure Postpartum Appointment:1 week for anxiety f/up  Additional Postpartum F/U:  6 wk PP check  Future Appointments:No future appointments. Follow up Visit:  Follow-up Information     Samantha Jimenez, CNM Follow up in 1 week(s).   Specialty: Obstetrics and Gynecology Why: for Anxiety follow up Contact information: [place] [address] [place], CNM Follow up in 6 week(s).   Specialty: Obstetrics and Gynecology Why: for postpartum check Contact information: [address] [place] [address] [phone]                     03/07/2022 Samantha Royals, MD

## 2022-03-07 NOTE — Progress Notes (Signed)
     PPD # 2 S/P NSVD, 2nd degr lac.   Live born female  Birth Weight: 7 lb 6.5 oz (3360 g) APGAR: 8, 9 Newborn Delivery   Birth date/time: 03/05/2022 23:43:45 Delivery type: Vaginal, Spontaneous    Baby name: Lysle Dingwall circumcision declined  Feeding: breast  S:  Reports feeling well, Tolerating po/ No nausea or vomiting. Bleeding is moderate             Pain controlled with acetaminophen and ibuprofen (OTC) .Up ad lib / ambulatory / voiding without difficulties   H/o Anxiety. Now getting a bit worse. Sertraline did not help. Prozac helped, took nothing in pregnancy. FOB not in picture, Her mom is local and lives close by, has takes 2 wks off and help and she works from home, so available.   O:  A & O x 3, in no apparent distress              VS:  Vitals:   03/06/22 1154 03/06/22 1529 03/06/22 2040 03/07/22 0545  BP: 103/70 104/66 106/69 96/62  Pulse: 93 95 83 89  Resp: 20 19 16 16   Temp:  97.9 F (36.6 C) 98.1 F (36.7 C) 98.2 F (36.8 C)  TempSrc: Oral Oral    SpO2: 99% 97% 98% 98%  Weight:      Height:        LABS:  Recent Labs    03/05/22 1125 03/06/22 0612  WBC 9.0 16.9*  HGB 13.8 11.3*  HCT 38.4 32.3*  PLT 209 197     Blood type: --/--/O POS (12/13 1125)  Rubella: Immune (10/09 0000)   Vaccines: TDaP    UTD    Gen: AAO x 3, NAD  Abdomen: soft, non-tender, non-distended             Fundus: firm, non-tender, U-2  Perineum: repair intact  Lochia: small  Extremities: no edema, no calf pain or tenderness    A/P: PPD # 2 31 y.o., 06-04-1996    Postpartum care following vaginal delivery 12/13   Perineal laceration, second degree   Pregnancy complic by factor V Leiden mutation   resumed Lovenox 40 MEQ daily till 6 wk PP  Follow up with hematology outpatient.     Doing well - stable status  Routine post partum orders Discharge home F/up Heme F/up with [facility] CNM or doc office or E visit in 7-10 days for PP anxiety/depression assessment. E visit if  unable to make office visit    9-10 Kasyn Stouffer,MD 03/07/2022, 11:08 AM

## 2022-03-07 NOTE — Lactation Note (Signed)
This note was copied from a baby's chart. Lactation Consultation Note  Patient Name: Samantha Jimenez [id number] Date: 03/07/2022 Reason for consult: Follow-up assessment Age:30 hours   P1: Term infant at 41+1 weeks Feeding preference: Breast Weight loss: 6%  Baby was swaddled and asleep in grandmother's arms when I arrived.  Reviewed current feeing plan and also plan for after discharge.  Mother had many good questions which I answered to her satisfaction.  Discussed the weight loss of 6% and the "longer" feedings of 60-90 minutes.  Emphasized the importance of nutritive feedings vs non-nutritive feedings.  Education completed on feeding effectively, keeping baby stimulated during feedings, feeding STS and placing him STS after feedings to observe for further cues.  Mother believes that he has been staying at her breast for comfort when he is done feeding.    Family has our op phone number for any questions after discharge.  Explained the op lc visit if needed and hand out at bedside; reviewed handout with mother.     Maternal Data    Feeding    LATCH Score                    Lactation Tools Discussed/Used    Interventions Interventions: Education;Breast feeding basics reviewed  Discharge Discharge Education: Engorgement and breast care;Outpatient recommendation (If desired; information given) Pump: Personal  Consult Status Consult Status: Complete Date: 03/07/22 Follow-up type: Call as needed    Samantha Jimenez Samantha Jimenez 03/07/2022, 10:19 AM

## 2022-03-18 ENCOUNTER — Telehealth (HOSPITAL_COMMUNITY): Payer: Self-pay | Admitting: *Deleted

## 2022-03-18 NOTE — Telephone Encounter (Signed)
Hospital Discharge Follow-Up Call:  Patient reports that she is well and has no concerns about her healing process.  EPDS today was 15 and she endorses that she has been dealing with anxiety for some time.  She has been looking for a therapist.  Will notify her midwife, Dorisann Frames, of EPDS score. Offered Maternal Mental Health resource list and patient asked that this be sent to her.    Patient says that baby is well and she has no concerns about baby's health.  They have a routine check at pediatrician's office on 03/21/22.  She reports that baby sleeps in bassinet.  Reviewed ABCs of Safe Sleep.

## 2022-07-08 IMAGING — US US OB COMP LESS 14 WK
1 series · 15 of 28 positions shown · non-contrast
Comparison: None Available.

CLINICAL DATA: Lower abdominal cramping.  Positive pregnancy test.

EXAM:
OBSTETRIC <14 WK ULTRASOUND
TECHNIQUE: Transabdominal ultrasound was performed for evaluation of the
gestation as well as the maternal uterus and adnexal regions.

[Series 1: us ob comp less 14 wk · 15 of 33 slices shown]
[im 1/33]
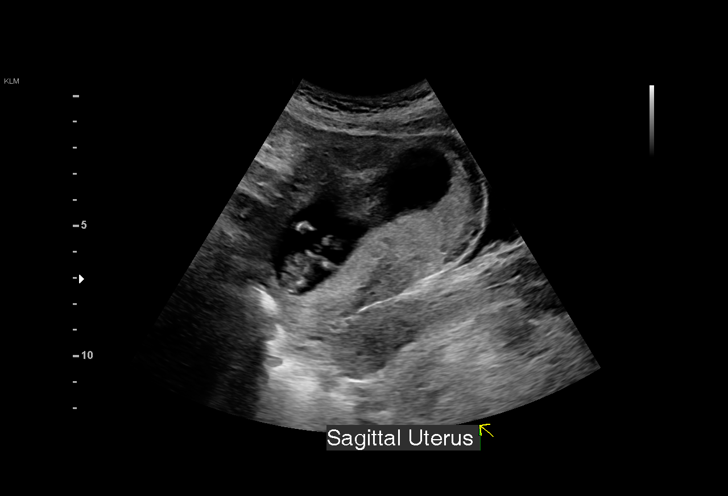
[im 3/33]
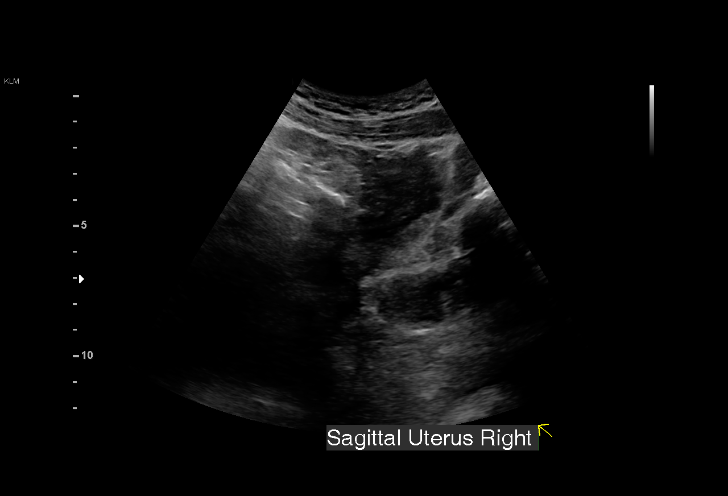
[im 5/33]
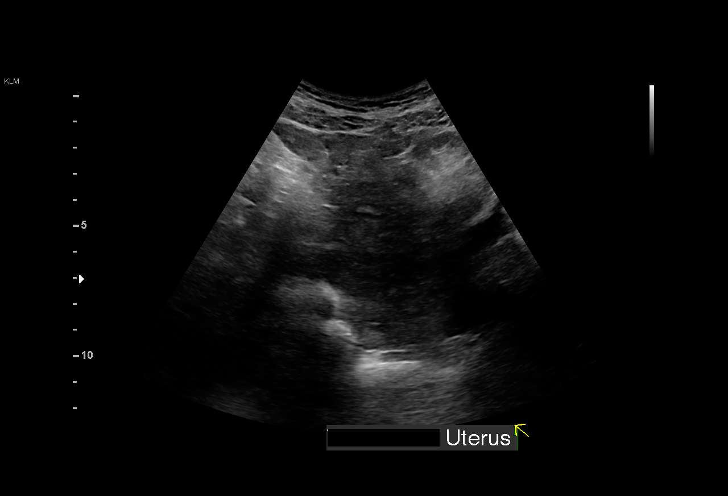
[im 8/33]
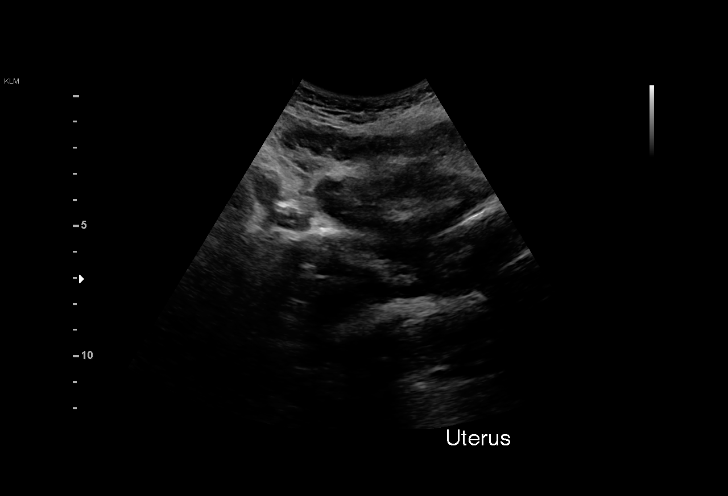
[im 10/33]
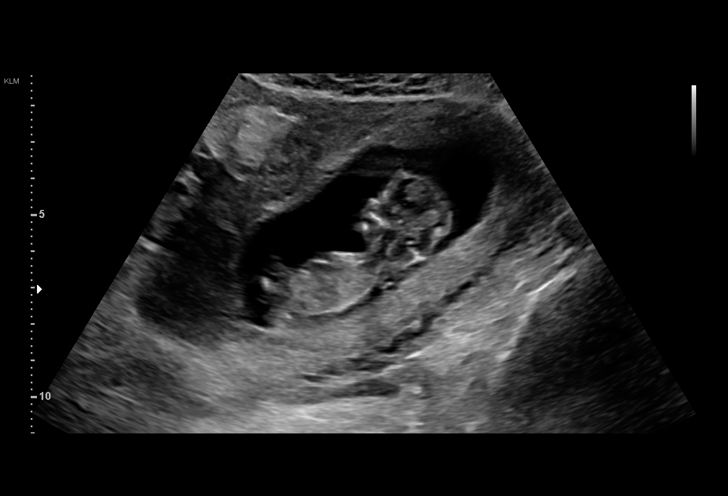
[im 12/33]
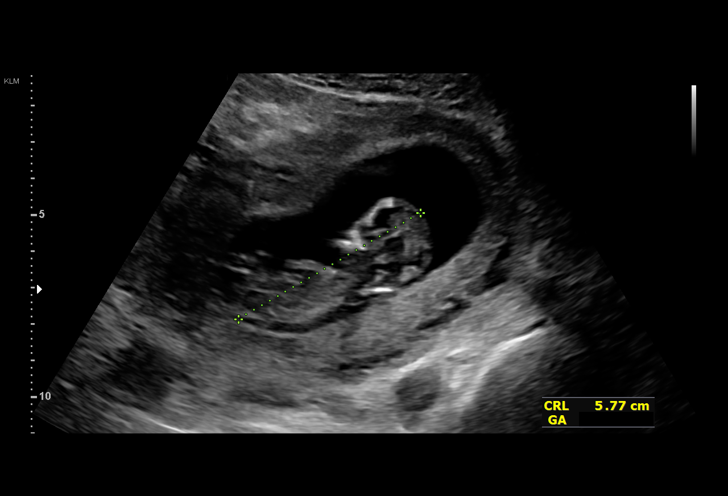
[im 15/33]
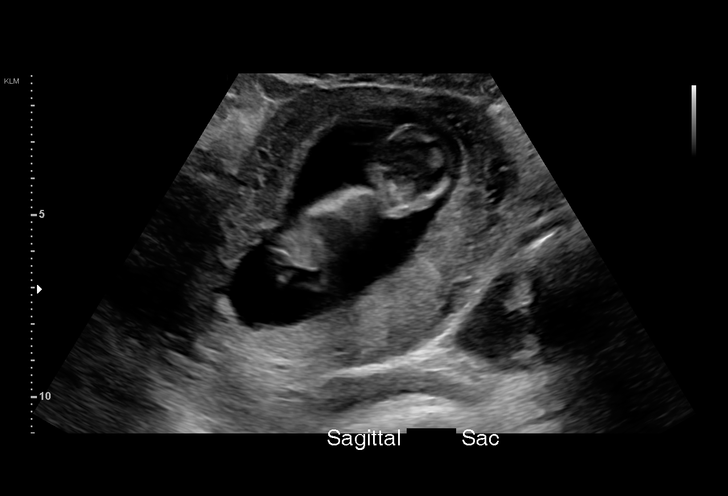
[im 17/33]
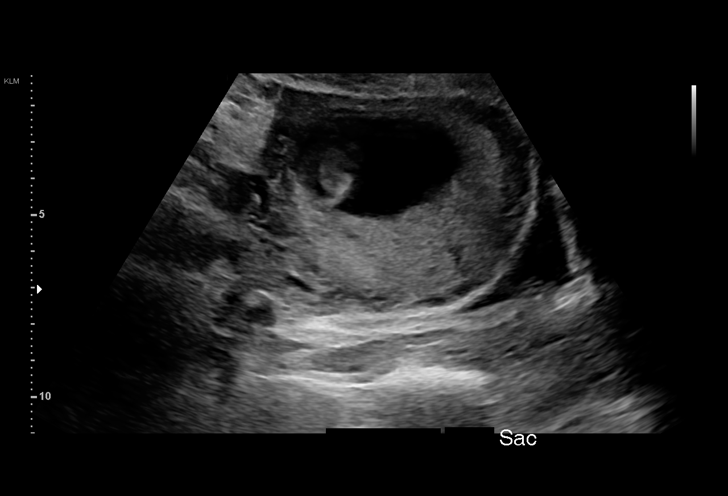
[im 18/33]
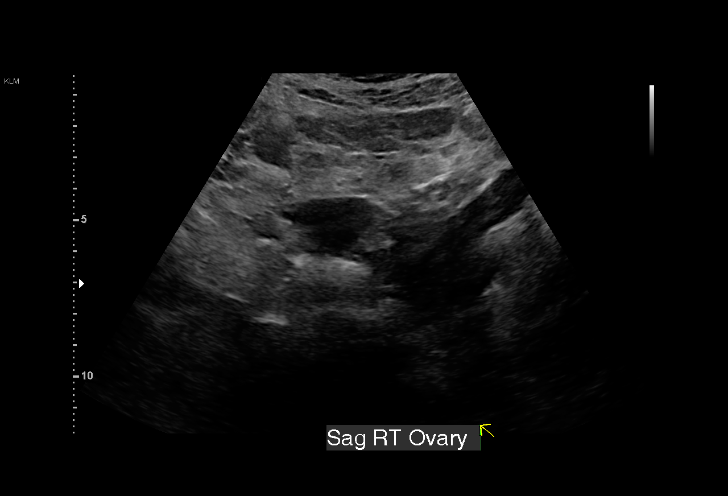
[im 21/33]
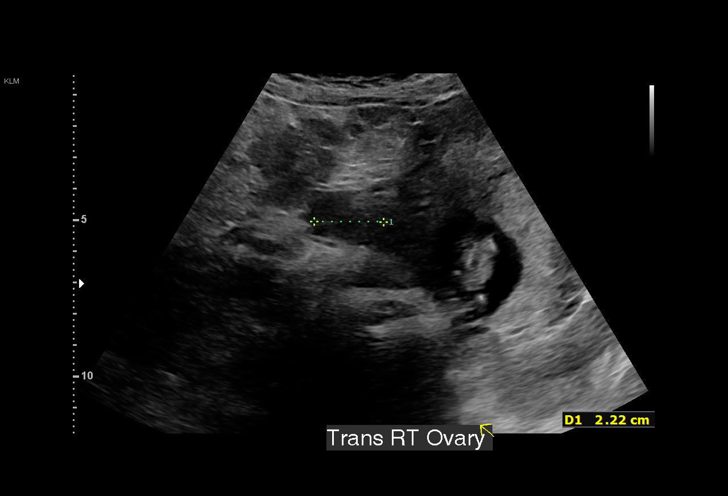
[im 23/33]
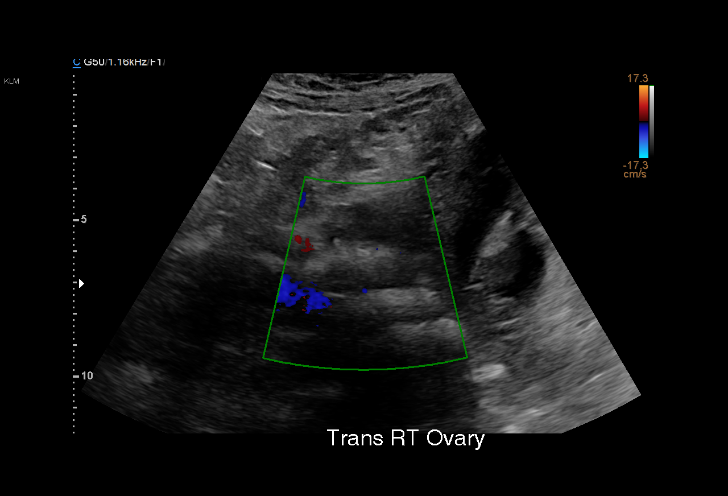
[im 25/33]
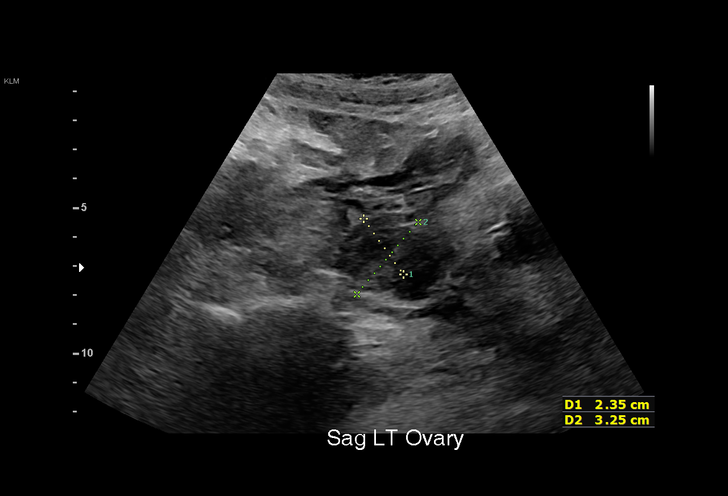
[im 28/33]
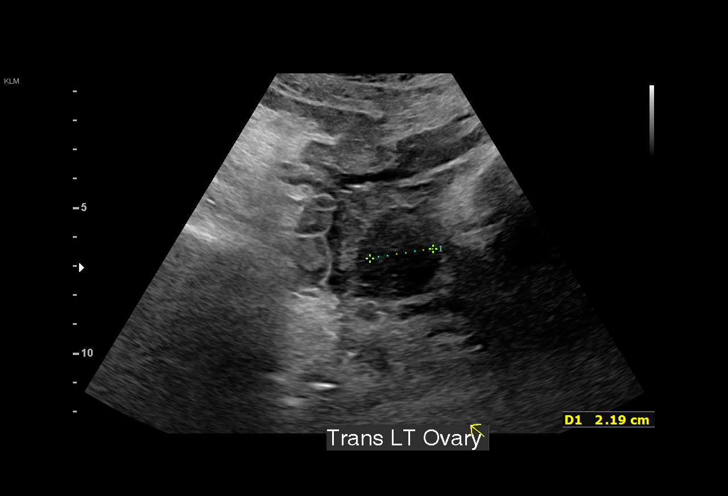
[im 30/33]
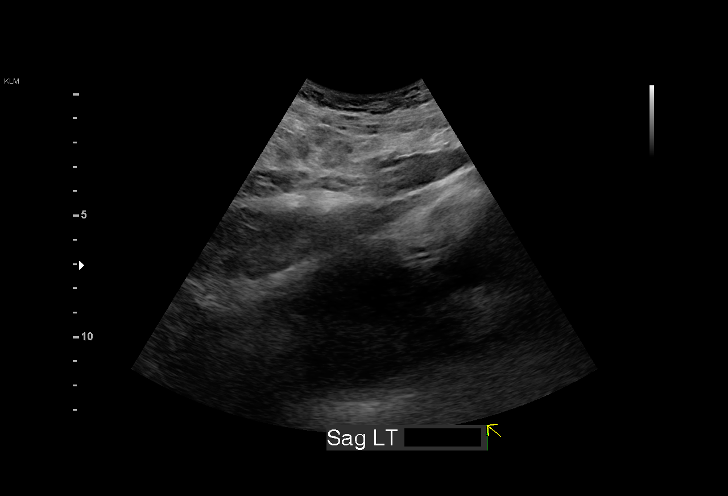
[im 33/33]
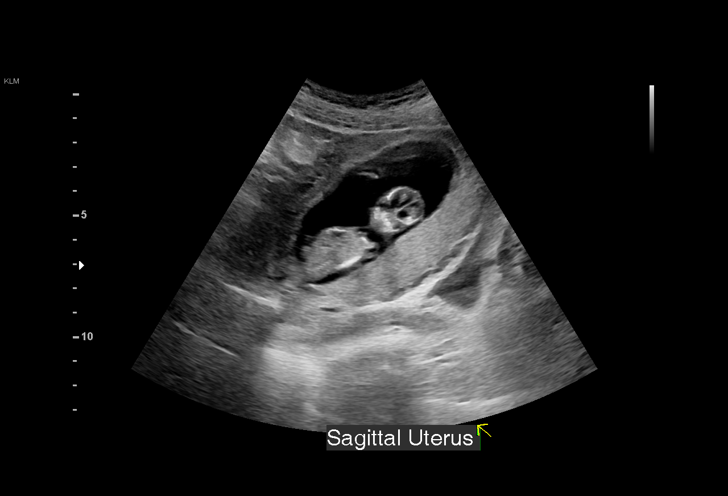

[15 of 28 positions shown; findings below may reference images not displayed]

FINDINGS: Intrauterine gestational sac: Single

Yolk sac:  Visualized

Embryo:  Visualized

Cardiac Activity: Visualized

Heart Rate: 169   Bpm

CRL:   59.1 mm   12 w 3 d                  US EDC: 02/27/2022

Subchorionic hemorrhage:  None visualized.

Maternal uterus/adnexae: Maternal ovaries unremarkable.
IMPRESSION: Single living intrauterine gestation at estimated 12 week 3 day
gestational age.
